# Patient Record
Sex: Male | Born: 1960 | Race: White | Hispanic: No | Marital: Married
Health system: Midwestern US, Community
[De-identification: ages and names within clinical notes are randomized; demographics above are authoritative.]

## PROBLEM LIST (undated history)

## (undated) DIAGNOSIS — J309 Allergic rhinitis, unspecified: Secondary | ICD-10-CM

## (undated) DIAGNOSIS — Z129 Encounter for screening for malignant neoplasm, site unspecified: Secondary | ICD-10-CM

## (undated) DIAGNOSIS — J329 Chronic sinusitis, unspecified: Secondary | ICD-10-CM

## (undated) DIAGNOSIS — J4531 Mild persistent asthma with (acute) exacerbation: Secondary | ICD-10-CM

## (undated) DIAGNOSIS — J4540 Moderate persistent asthma, uncomplicated: Secondary | ICD-10-CM

## (undated) DIAGNOSIS — M5412 Radiculopathy, cervical region: Secondary | ICD-10-CM

---

## 2008-09-07 LAB — RESPIRATORY ALLERGEN PROFILE
Aspergillus alternata IgE: <0.1 kU/l (ref <=0.34)
Aspergillus fumigatus IgE: <0.1 kU/l (ref <=0.34)
Bermuda Grass IgE: 0.47 kU/l — ABNORMAL HIGH (ref <=0.34)
Cat Dander IgE: <0.1 kU/l (ref <=0.34)
Cockroach IgE: 0.32 kU/l (ref <=0.34)
Common-Short Ragweed IgE: 0.64 kU/l — ABNORMAL HIGH (ref <=0.34)
D. farinae IgE: <0.1 kU/l (ref <=0.34)
Elm IgE: 0.42 kU/l — ABNORMAL HIGH (ref <=0.34)
Hormodendrum Hordei IgE: <0.1 kU/l (ref <=0.34)
Immunoglobulin E: 145 [IU]/mL (ref 0–180)
Johnson Grass IgE: 0.48 kU/l — ABNORMAL HIGH (ref <=0.34)
June/Ky Blue(Poa prat.) IgE: 0.64 kU/l — ABNORMAL HIGH (ref <=0.34)
Lamb's Quarter IgE: 0.42 kU/l — ABNORMAL HIGH (ref <=0.34)
Mite Dust Pteronyssinus IgE: <0.1 kU/l (ref <=0.34)
Oak Tree IgE: 0.44 kU/l — ABNORMAL HIGH (ref <=0.34)
Pecan Tree IgE: 0.91 kU/l — ABNORMAL HIGH (ref <=0.34)
Walnut Tree IgE: 0.43 kU/l — ABNORMAL HIGH (ref <=0.34)

## 2008-09-07 LAB — COMMON FOOD ALLERGEN PROFILE
Clams IgE: 0.99 kU/l — ABNORMAL HIGH (ref <=0.34)
Codfish IgE: <0.1 kU/l (ref <=0.34)
Corn Cultivated IgE: 0.39 kU/l — ABNORMAL HIGH (ref <=0.34)
Egg White IgE: <0.1 kU/l (ref <=0.34)
Immunoglobulin E: 145 [IU]/mL (ref 0–180)
Milk, Cow's IgE: <0.1 kU/l (ref <=0.34)
Peanut IgE: 0.4 kU/l — ABNORMAL HIGH (ref <=0.34)
Scallop IgE: 0.53 kU/l — ABNORMAL HIGH (ref <=0.34)
Shrimp IgE: 0.22 kU/l (ref <=0.34)
Soybean IgE: 0.34 kU/l (ref <=0.34)
Walnut IgE: 0.33 kU/l (ref <=0.34)
Wheat IgE: 0.32 kU/l (ref <=0.34)

## 2010-04-06 MED ORDER — HYDROCODONE-HOMATROPINE 5-1.5 MG/5ML PO SYRP
Freq: Four times a day (QID) | ORAL | Status: AC | PRN
Start: 2010-04-06 — End: 2010-04-13

## 2010-04-06 MED ORDER — ALBUTEROL SULFATE HFA 108 (90 BASE) MCG/ACT IN AERS
108 (90 Base) MCG/ACT | Freq: Four times a day (QID) | RESPIRATORY_TRACT | Status: DC | PRN
Start: 2010-04-06 — End: 2010-12-12

## 2010-04-06 NOTE — Progress Notes (Signed)
Subjective:      Patient ID: Harold Gordon is a 50 y.o. male.    HPI Comments: Tried to sleep sitting up last pm - using advil - fever/ chills - started 2 nights ago - really bad last pm - got chest pressure/ burning in chest when laid down flat - thought allergy initially - tree pollen bad per allergy testing - feels some better now but bad night -hard to sleep - temp to 102 2 nights ago. Used dayquil  Yesterday - ? If helped - felt better during day. Bad headache - fever higher in afternoon / evening.      Breathing Problem  He complains of difficulty breathing. Associated symptoms include a fever.   Fever         Review of Systems   Constitutional: Positive for fever.       Objective:   Physical Exam   Constitutional: He is oriented to person, place, and time. He appears well-developed and well-nourished. No distress.   HENT:        Nares mod congestion  Op boggy     Eyes: No scleral icterus.   Pulmonary/Chest: Effort normal. No respiratory distress. He has no wheezes. He has no rales.   Musculoskeletal: He exhibits no edema.   Neurological: He is alert and oriented to person, place, and time.   Skin: Skin is warm and dry.   Psychiatric: He has a normal mood and affect.       Assessment:      1. URI (upper respiratory infection)  X-ray chest PA and lateral           Plan:      Hycodan hs prn / albuterol prn - use and se d/w pt  Push fluids - mucinex D w/ claritin / advil prn  Cxr/ f/u if worsening/ no better in next several days.

## 2010-04-06 NOTE — Telephone Encounter (Signed)
Pt states having chest congestion, hard time breathing, fever, chills.  Tried Night Quil.  Wants appt today.  Per [facility] 11:45 a.m.appt.  Pt informed.

## 2010-05-16 NOTE — Progress Notes (Signed)
Subjective:      Patient ID: Harold Gordon is a 50 y.o. male.    HPI Comments: Fasting today - here for gen'l check up - had allergy testing done - claritin daily - used steroid from allergist and stopped taking it.  Not bad sneeze attacks on claritin. Taking krill oil.  Only used albuterol 1-2x in quite a while.  Sleep pretty good at night - not sure he's interested in colonoscopy - will consider - sleeps pretty well - up around 2 am to urinate.  Coffee in am - 3-4 cups - drinks powerade zero through day. Drinks about 6 beers/ night. Stays active physically = walks a lot. Diet pretty decent - avoids fast food for most part - eats subway for lunch.  Lipid has been high in past. Palpitations periodically - no cp - usually happens at night - feels irregular - no sob/ light-headed.  Last few weeks - some fleeting duoll pain left upper chest - ? Muscular - no ger problem - bm's ok 2-3x/ day. Stream weaker.  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  Hearing slightly decreased - vision declining -on cheaters to read.  Works in truck.  Palpitations not frequent - 1-2x/ months.  Smokes cigars 1/d.  Left numbness related to ddd lumbar.  Back pain if stands in one place esp hard surface for long -hamstrings are tight.       Review of Systems    Objective:   Physical Exam   Constitutional: He is oriented to person, place, and time. He appears well-developed and well-nourished. No distress.   HENT:   Head: Normocephalic and atraumatic.   Mouth/Throat: Oropharynx is clear and moist. No oropharyngeal exudate.   Eyes: Conjunctivae are normal. No scleral icterus.   Neck: No thyromegaly present.        No carotid bruits bilaterally     Cardiovascular: Normal rate, regular rhythm, normal heart sounds and intact distal pulses.    No murmur heard.  Pulmonary/Chest: Effort normal and breath sounds normal. No respiratory distress. He has no wheezes.  He has no rales.   Abdominal: Soft. He exhibits no distension. There is no tenderness.   Musculoskeletal: He exhibits no edema.   Lymphadenopathy:     He has no cervical adenopathy.   Neurological: He is alert and oriented to person, place, and time.   Skin: Skin is warm and dry.   Psychiatric: He has a normal mood and affect.       Assessment:      1. Encounter for screening colonoscopy  Ambulatory referral to Gastroenterology   2. Hypercholesteremia  Lipid panel, Comprehensive metabolic panel   3. Elevated BP     4. Palpitations  EKG 12 lead   5. Prostate cancer screening  PSA, prostatic specific antigen   6. Well adult exam     7. DDD (degenerative disc disease), lumbar             Plan:      otc options for knuckle arthritis d/w pt  Diet/exercise d/w pt  Prostate/ colon ca screen d/w pt - hold on dre -   Hold on further monitoring unless palps worsen - ekg today  Check labs  Monitor bp

## 2010-05-17 LAB — COMPREHENSIVE METABOLIC PANEL
ALT: 41 U/L — ABNORMAL HIGH (ref 10–40)
AST: 32 U/L (ref 15–37)
Albumin/Globulin Ratio: 1.7 (ref 1.1–2.2)
Albumin: 4.6 gm/dl (ref 3.4–5.0)
Alkaline Phosphatase: 65 U/L (ref 45–129)
BUN: 15 mg/dl (ref 7–18)
CO2: 27 mEq/L (ref 21–32)
Calcium: 9.7 mg/dl (ref 8.3–10.6)
Chloride: 106 mEq/L (ref 99–110)
Creatinine: 1.1 mg/dl (ref 0.9–1.3)
GFR Est, African/Amer: >60
GFR, Estimated: >60 (ref >60)
Glucose: 97 mg/dl (ref 70–99)
Potassium: 4.9 mEq/L (ref 3.5–5.1)
Sodium: 140 mEq/L (ref 136–145)
Total Bilirubin: 0.5 mg/dl (ref 0.0–1.0)
Total Protein: 7.4 gm/dl (ref 6.4–8.2)

## 2010-05-17 LAB — LIPID PANEL
Cholesterol, Total: 248 mg/dl — ABNORMAL HIGH (ref <200)
HDL: 70 mg/dl — ABNORMAL HIGH (ref 40–60)
LDL Calculated: 158 mg/dl — ABNORMAL HIGH (ref <100)
Triglycerides: 94 mg/dl (ref <150)
VLDL Cholesterol Calculated: 19 mg/dl

## 2010-05-17 LAB — PSA PROSTATIC SPECIFIC ANTIGEN: PSA: 0.66 ng/ml

## 2010-12-12 NOTE — Progress Notes (Signed)
Subjective:      Patient ID: Harold Gordon is a 50 y.o. male.    HPI Comments: Neck pain for awhile but worse in last week especially - got rearended [place] 6 years ago - sensitive to sleeping wrong since - sneezed and flared things up in last week - hand tingling - pain down left arm - using tylenol/ advil - rom gets affected - gradually improving.  Painful to look down and up again - now much better.  Some rom restriction esp on left arm. 80% better - still pain down left arm if looks up quickly -   More arthritis change in hands -uses hands a lot - ? tx options    Neck Pain         Review of Systems   HENT: Positive for neck pain.        Objective:   Physical Exam   Constitutional: He is oriented to person, place, and time. He appears well-developed and well-nourished. No distress.   Eyes: No scleral icterus.   Pulmonary/Chest: Effort normal.   Musculoskeletal: He exhibits no edema.        Minimal spinous/ neck/ upper back ttp  Fairly good rom neck w/ somewhat decreased rom left shoulder   Neurological: He is alert and oriented to person, place, and time.        Strength/ sensation grossly intact upper exts   Skin: Skin is warm and dry.   Psychiatric: He has a normal mood and affect.       Assessment:      1. Cervical radiculopathy  fexofenadine (ALLEGRA) 180 MG tablet, MRI CERVICAL SPINE WO CONRAST, Ambulatory referral to Physical Therapy   2. Arthritis             Plan:      Mri c-spine - consider PT vs. pmr referral pending sx and mri results    Motrin prn - hold on further tx for now.  tx for oa d/w pt

## 2010-12-12 NOTE — Patient Instructions (Signed)
See note

## 2011-01-10 ENCOUNTER — Telehealth

## 2011-01-10 NOTE — Telephone Encounter (Signed)
PRINTED RESULTS AND LEFT FOR DR Kennith Center' TO REVIEW.

## 2011-01-10 NOTE — Telephone Encounter (Signed)
Pt is calling for results of MRI of his C Spine. Imaging shows that Dr Kennith Center reviewed but no notes for patient. Please call pt with results and instructions.

## 2011-01-11 NOTE — Telephone Encounter (Signed)
Spoke w/ patient - complex discogenic/ arthritic/ bone spur process accounting for c7 radicular sx on left side - sx worsening - refer to dr. Gwenevere Ghazi for eval - d/w pt.

## 2011-01-12 NOTE — Telephone Encounter (Signed)
Pt was referred to [facility] by dr Kennith Center for neck pain, x couple weeks    Recent mri    pls call pt at  [phone]

## 2011-01-13 NOTE — Telephone Encounter (Signed)
Scheduled with JBJ

## 2011-02-06 NOTE — Progress Notes (Signed)
Chief Complaint   Patient presents with   . Neck Pain     Referred by Dr Kennith Center   . Arm Pain     Left arm pain for about 2 months     Pt is daily but mostly annoying. No weaknss or severe numbness.    I have reviewed the patients MRI images as well as radiology report:  Cervical epic: Left c67 hnp/nfs, more mild c45    Pain level  0/10  Location Left arm and neck  Description aching and pins and needles  Radiation   Yes  Duration  2 month(s)  Time  mild    PMH/ROS/Medication/other pertinent history reviewed with patient.    Exam:  Filed Vitals:    02/06/11 1340   BP: 149/96   Pulse: 96       General Exam:  Pt is alert and oriented x 3 and in no acute distress.  Gait is heel toe with no limp or instability.  Mood and affect are appropriate.    HEENT:  normal cephalic.  Neck ROM is Normal with pain at extremes.  Negative Spurling sign to bilateral.  No lymphadenopathy.  No rash or skin irritation.    Upper Extremities:  Bilateral upper extremity with 5/5 motor function, sensation intact to light touch.  Full ROM major joints. No subluxation or instability of major joints.  No cyanosis, clubbing, edema or rash and the vasculature is intact.  Negative Hoffman sign bilateral, trace brachioradialis reflex.    Impression:    1. HNP (herniated nucleus pulposus), cervical  dexamethasone (DECADRON) 6 MG tablet, diclofenac (VOLTAREN) 75 MG EC tablet     A/P  Pt is dealing well with this problem so far.  I recommend he try oral steroid then nsaid.  If no better consider PT or esi if pain severe.    Dimas Chyle, MD    Cc: Nani Ravens, MD

## 2011-02-15 NOTE — Communication Body (Signed)
Chief Complaint   Patient presents with   ??? Neck Pain     Referred by Dr Kennith CenterHines   ??? Arm Pain     Left arm pain for about 2 months     Pt is daily but mostly annoying. No weaknss or severe numbness.    I have reviewed the patients MRI images as well as radiology report:  Cervical epic: Left c67 hnp/nfs, more mild c45    Pain level  0/10  Location Left arm and neck  Description aching and pins and needles  Radiation   Yes  Duration  2 month(s)  Time  mild    PMH/ROS/Medication/other pertinent history reviewed with patient.    Exam:  Filed Vitals:    02/06/11 1340   BP: 149/96   Pulse: 96       General Exam:  Pt is alert and oriented x 3 and in no acute distress.  Gait is heel toe with no limp or instability.  Mood and affect are appropriate.    HEENT:  normal cephalic.  Neck ROM is Normal with pain at extremes.  Negative Spurling sign to bilateral.  No lymphadenopathy.  No rash or skin irritation.    Upper Extremities:  Bilateral upper extremity with 5/5 motor function, sensation intact to light touch.  Full ROM major joints. No subluxation or instability of major joints.  No cyanosis, clubbing, edema or rash and the vasculature is intact.  Negative Hoffman sign bilateral, trace brachioradialis reflex.    Impression:    1. HNP (herniated nucleus pulposus), cervical  dexamethasone (DECADRON) 6 MG tablet, diclofenac (VOLTAREN) 75 MG EC tablet     A/P  Pt is dealing well with this problem so far.  I recommend he try oral steroid then nsaid.  If no better consider PT or esi if pain severe.    Dimas ChyleJohn B. Jacquemin, MD    Cc: Nani Ravensirk R Hines, MD

## 2011-08-07 MED ORDER — IPRATROPIUM BROMIDE 0.06 % NA SOLN
0.06 % | Freq: Four times a day (QID) | NASAL | Status: DC | PRN
Start: 2011-08-07 — End: 2012-01-08

## 2011-08-07 NOTE — Patient Instructions (Signed)
See note

## 2011-08-07 NOTE — Progress Notes (Signed)
Subjective:      Patient ID: Harold Gordon is a 51 y.o. male.    HPI Comments: Cough in am - worst - but some through day - wheezes when inhales.  Starts after been up for few minutes. Going on for awhile - weeks/ months - allegra most days -no change if skips allegra.  Not a lot of nasal drainage/ pnd - more occ sniffles.  White clear phlegm - not more doe than usual.   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    Nothing other than krill oil w/ allegra. No swelling/ leg pain.   Cigars in past - quit to see if helped and hasn't been of any benefit.  No hx of asthma  No gerd except rare w/ spicy food      Cough    Other  Associated symptoms include coughing.       Review of Systems   Respiratory: Positive for cough.        Objective:   Physical Exam   Constitutional: He appears well-developed. No distress.   HENT:   Mouth/Throat: Oropharynx is clear and moist. No oropharyngeal exudate.   Eyes: Conjunctivae are normal. No scleral icterus.   Cardiovascular: Normal rate, regular rhythm, normal heart sounds and intact distal pulses.  Exam reveals no gallop.    No murmur heard.  Pulmonary/Chest: Effort normal and breath sounds normal. No respiratory distress. He has no wheezes. He has no rhonchi. He has no rales.   Abdominal: Soft. Bowel sounds are normal. He exhibits no distension. There is no tenderness.   Musculoskeletal: He exhibits no edema.   Neurological: He is alert.   Skin: Skin is intact. No rash noted. No erythema.   Psychiatric: He has a normal mood and affect.       Assessment:      1. Cough  ipratropium (ATROVENT) 0.06 % nasal spray, X-ray chest PA and lateral, Pulmonary function test            Plan:      Colonoscopy soon  atrovent ns w/ allegra for pnd  If not better in next 1-2 weeks - check cxr/ pft  No ger or other obvious cause -no longer smoking

## 2011-08-24 MED ORDER — ALBUTEROL SULFATE HFA 108 (90 BASE) MCG/ACT IN AERS
108 (90 Base) MCG/ACT | Freq: Once | RESPIRATORY_TRACT | Status: AC
Start: 2011-08-24 — End: 2011-08-24
  Administered 2011-08-24: 20:00:00 via RESPIRATORY_TRACT

## 2011-08-25 MED ORDER — ALBUTEROL SULFATE HFA 108 (90 BASE) MCG/ACT IN AERS
108 (90 Base) MCG/ACT | Freq: Four times a day (QID) | RESPIRATORY_TRACT | Status: DC | PRN
Start: 2011-08-25 — End: 2011-12-20

## 2011-08-25 MED FILL — VENTOLIN HFA 108 (90 BASE) MCG/ACT IN AERS: 108 (90 Base) MCG/ACT | RESPIRATORY_TRACT | Qty: 8

## 2011-08-25 NOTE — Procedures (Signed)
PATIENT NAME:                   PA #:             MR #Harold Gordon, Harold Gordon                   [id number]        [id number]         ATTENDING PHYSICIAN:                     DATE:        DIS DATE:       Dierdre Forth, MD                  08/24/2011   08/24/2011      DATE OF BIRTH:     AGE:            PATIENT TYPE:      RM #:           04/06/1960         51              OPF                                   INDICATION:  Chronic cough.     INTERPRETATION:  Spirometry showed normal FVC of 4.4 L 102% predicted and  normal FEV1 of 3.2 L 91% predicted.  FEV1/FVC ratio was decreased.  There was  a positive response to bronchodilators demonstrated.  Lung volumes showed  normal total lung capacity of 96% predicted.  Diffusion capacity showed  normal DLCO of 100% predicted.       IMPRESSION:  Mild obstructive defect with positive response to  bronchodilators, normal lung volumes and diffusion capacity.                                              Joycelyn Das, MD     [id number]  DD: 08/25/2011 12:51   DT: 08/25/2011 13:19   Job #: [id number]  CC: Army Melia, MD

## 2011-12-21 MED ORDER — PROVENTIL HFA 108 (90 BASE) MCG/ACT IN AERS
108 (90 Base) MCG/ACT | RESPIRATORY_TRACT | Status: DC
Start: 2011-12-21 — End: 2012-02-27

## 2011-12-26 NOTE — Other (Unsigned)
CURRENT STATUS IS EMERGENCY PENDING CARE MANAGEMENT DESIGNATION.     Any questions regarding MEDICAL RECORDS should be directed to Medical   Records: [facility] [phone] or [facility]   [phone]. Any questions regarding BILLING should be directed to the Care   Management Departments: [facility] [phone] or [facility] [phone].         _________________________________  Signed byJamison Neighbor:    [facility]  NOTIFY    ZZ    D: 12/26/2011 11:34 PM  T:    This document is confidential medical information.  Unauthorized disclosure or   use of this information is prohibited by law.  If you are not the intended recipient of this document, please advise us by   calling immediately [phone].

## 2011-12-27 NOTE — Other (Unsigned)
ED PATIENT ENCOUNTER ARRIVAL: 12/26/11 2334     EMERGENCY DEPARTMENT - General NOTE     CHIEF COMPLAINT Chief Complaint Patient presents with   Cough   chest   congestion, nasal congestion, feels SOB for about 1 wk     HPI Harold Gordon is a 51 year old male who presents to the ER for nasal   congestion, cough and shortness of breath for approximately one week. Patient   has been diagnosed with asthma in the past. Patient came in today because he   was coughing and having trouble breathing. Patient tried her albuterol inhaler   at home without relief.     PAST MEDICAL HISTORY Past Medical History Diagnosis Date   Asthma     SURGICAL HISTORY Past Surgical History Procedure Laterality Date     Appendectomy   Back surgery     CURRENT MEDICATIONS Prior to Admission medications Not on File     ALLERGIES No Known Allergies     FAMILY HISTORY No family history on file.     SOCIAL HISTORY History     Social History   Marital Status: Married   Spouse Name: N/A   Number of   Children: N/A   Years of Education: N/A     Social History Main Topics   Smoking status: Light Tobacco Smoker   Smokeless   tobacco: Never Used   Alcohol Use: Not on file   Drug Use: Not on file     Sexually Active: Not on file     Other Topics Concern   Not on file     Social History Narrative   No narrative on file     REVIEW OF SYSTEMS Constitutional:  Denies fever, chills, weight loss or   weakness Eyes:  Denies photophobia or discharge HENT:  Denies sore throat or   ear pain Respiratory:  Positive for cough. Positive for shortness of breath.   Cardiovascular:  Denies chest pain, palpitations or swelling GI:  Denies   abdominal pain, nausea, vomiting, or diarrhea Musculoskeletal:  Denies back   pain Skin:  Denies rash Neurologic:  Denies headache, focal weakness or   sensory changes Endocrine:  Denies polyuria or polydypsia Lymphatic:  Denies   swollen glands Psychiatric:  Denies depression, suicidal ideation or homicidal   ideation All systems  negative except as marked.     PHYSICAL EXAM VITAL SIGNS: BP 137/85   Pulse 91   Temp(Src) 98.1  F (36.7  C)   (Oral)   Resp 20   SpO2 96% Constitutional:  Well developed, Well nourished,   No acute distress, Non-toxic appearance. HENT:  Normocephalic, Atraumatic,   Bilateral external ears normal, Oropharynx moist, No oral exudates, Nose   normal. Neck- Normal range of motion, No tenderness, Supple, No stridor. Eyes:    PERRL, EOMI, Conjunctiva normal, No discharge. Respiratory:  Normal breath   sounds, No respiratory distress, No chest tenderness. Positive for mild   wheezing throughout. Cardiovascular:  Normal heart rate, Normal rhythm, No   murmurs, No rubs, No gallops. GI:  Bowel sounds normal, Soft, No tenderness,   No masses, No pulsatile masses. Musculoskeletal:  Intact distal pulses, No   edema, No tenderness, No cyanosis, No clubbing. Good range of motion in all   major joints. No tenderness to palpation or major deformities noted. Back- No   tenderness. Integument:  Warm, Dry, No erythema, No rash. Lymphatic:  No   lymphadenopathy noted. Neurologic:  Alert & oriented  x 3, Normal motor   function, Normal sensory function, No focal deficits noted. Psychiatric:    Affect normal, Judgment normal, Mood normal.     LABORATORY No results found for this or any previous visit (from the past 24   hour(s)).     RADIOLOGY XR CHEST PA AND LATERAL    (Results Pending)     Chest x-ray: Interpretation done by myself. Negative for acute process.     ED COURSE & MEDICAL DECISION MAKING Pertinent Labs & Imaging studies   reviewed. (See chart for details) Patient felt better in the ER after an   aerosol treatment. Patient states for the past couple of days he has been   trying his therapy for an inhaler without relief. Patient denies fever chills.   Since his symptoms have been going on for approximately one week and is   having trouble breathing and not getting much relief from the inhaler I will   start him on antibiotics.  I will also start him on prednisone along with a   taper. After the aerosol treatment he felt much better and his breathing   improved significantly. Patient is stable appropriate for discharge and   followup in the outpatient setting. At no time in the ER was he in acute   respiratory arrest.     The patient was given the following medication in the Emergency department:     Medication Administration from 12/26/2011 2334 to 12/27/2011 0022      Date/Time Order Dose Route Action Action by Comments   12/26/2011 2356   ipratropium-albuterol (DUONEB) nebulizer solution 3 mL 3 mL Nebulization Given   Imogene Burnonya R Popov   12/27/2011 0016 azithromycin (ZITHROMAX) tablet 500 mg 500 mg   Oral Given Maryfrances BunnellStephanie A Becker   12/27/2011 0016 predniSONE (DELTASONE) tablet   60 mg 60 mg Oral Given Maryfrances BunnellStephanie A Becker         FINAL DIAGNOSIS:     1. Acute upper respiratory infection 2. Shortness of breath 3. Cough         The patient was given the following medications to go home with.     Discharge Medication List as of 12/27/2011 12:17 AM     START taking these medications  Details azithromycin (ZITHROMAX) 500 MG TABS   Take 1 tablet by mouth daily for 4 days.Normal, Disp-4 tablet, R-0     methylPREDNISolone 4 MG KIT Take as directed until gonePrint, Disp-1 kit, R-0         Electronically signed by: West PughJoel A. Neil Crouchrane, DO         Drucie Iprane, Joel A., DO 12/27/11 0310         _________________________________  Signed by:    West PughJOEL A. CRANE    10    D: 12/27/2011 03:10 AM  T: 12/27/2011 03:10 AM    This document is confidential medical information.  Unauthorized disclosure or   use of this information is prohibited by law.  If you are not the intended recipient of this document, please advise us by   calling immediately [phone].

## 2012-01-08 MED ORDER — NORMAL SALINE FLUSH 0.9 % IV SOLN
0.9 % | INTRAVENOUS | Status: DC
Start: 2012-01-08 — End: 2012-01-09

## 2012-01-08 MED ORDER — SODIUM CHLORIDE 0.9 % IJ SOLN
0.9 % | Freq: Once | INTRAMUSCULAR | Status: DC | PRN
Start: 2012-01-08 — End: 2012-01-09

## 2012-01-08 MED ORDER — IOVERSOL 68 % IV SOLN
68 % | Freq: Once | INTRAVENOUS | Status: AC | PRN
Start: 2012-01-08 — End: 2012-01-08
  Administered 2012-01-08: 20:00:00 via INTRAVENOUS

## 2012-01-08 MED ORDER — HYDROCODONE-HOMATROPINE 5-1.5 MG/5ML PO SYRP
Freq: Four times a day (QID) | ORAL | Status: AC | PRN
Start: 2012-01-08 — End: 2012-01-15

## 2012-01-08 MED FILL — NORMAL SALINE FLUSH 0.9 % IV SOLN: 0.9 % | INTRAVENOUS | Qty: 20

## 2012-01-08 NOTE — Progress Notes (Signed)
Subjective:      Patient ID: Harold Gordon is a 52 y.o. male.    HPI  PT C/O SEVERE CHEST CONGESTION WITH SOB, COUGH WITH CLEAR PHLEGM, NASAL DR CLEAR, PND, HEADACHE, NO FEVER ON/OFF X2 YEARS.  PT WAS SEEN IN THE ER ON CHRISTMAS EVE FOR THE SAME SYMPTOMS.  PT HAS HISTORY OF WORKING IN CHEMICALS FOR 20 YEARS.  tx'd for zpak/ medrol dose pack - albuterol neb which works better than hfa  Progressively worsening for 2 years - now more sob - took albuterol every 2 hours last pm to sleep - clear mucus - bad again today - this seems to be intermittent - ? Tied into breathing/ cough.  Noted pnd   No asthma as a kid -   Not smoking cigarettes for 20 years - no cigars for awhile  No f/c/ aches in last week or two.  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  Last albuterol about 8 hours ago.    Review of Systems    Objective:   Physical Exam   Constitutional: He appears well-developed. No distress.   HENT:   Mouth/Throat: Oropharynx is clear and moist.   Eyes: Conjunctivae are normal. No scleral icterus.   Cardiovascular: Normal rate, regular rhythm and normal heart sounds.  Exam reveals no gallop.    No murmur heard.  Pulmonary/Chest: Effort normal and breath sounds normal. No respiratory distress. He has no wheezes. He has no rhonchi. He has no rales.   Musculoskeletal: He exhibits no edema.   Neurological: He is alert.   Skin: Skin is intact. No rash noted. No erythema.   Psychiatric: He has a normal mood and affect.       Assessment:      1. SOB (shortness of breath)  External Referral To Pulmonology, CT chest with contrast   2. 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

## 2012-01-08 NOTE — Patient Instructions (Addendum)
See note

## 2012-01-10 MED ORDER — BENZONATATE 200 MG PO CAPS
200 MG | ORAL_CAPSULE | ORAL | Status: DC
Start: 2012-01-10 — End: 2012-02-27

## 2012-01-10 NOTE — Telephone Encounter (Signed)
Pt needs a referral to a pulmonologist.  Dr Katherine MantleIsentroth is not seeing new pt  Pt has called twice and they are not calling him back..    Do you have the name of one pt can see?

## 2012-01-11 NOTE — Telephone Encounter (Signed)
Per [facility] can have him see Dr.Colangelo.  Called pt he is driving he will call back for number.  It is [phone].

## 2012-01-11 NOTE — Telephone Encounter (Signed)
Called pt again.  Pt was able to write down number this time.

## 2012-01-26 MED ORDER — MOMETASONE FUROATE 50 MCG/ACT NA SUSP
50 MCG/ACT | Freq: Every day | NASAL | Status: DC
Start: 2012-01-26 — End: 2013-06-25

## 2012-01-26 NOTE — Progress Notes (Signed)
Subjective:      Patient ID: Harold Gordon is a 52 y.o. male.    HPI  The patient is referredfor evaluation of cough, wheezing and shortness of breath by Dr. Kennith Center. The patient has a history of cough that goes back several years. He also gives a history of allergies. He had some allergy testing about 3 years ago and had a number of environmental allergies. He says his allergies are worse in the spring and the fall but he has constant sinus drainage. He has occasional gastroesophageal reflux but this is a minor symptom for him. He has coughing and often produces some phlegm in the morning. He has had wheezing. He ended up in the emergency room on Christmas Eve this year with a flareup of wheezing and shortness of breath. He was given a breathing treatment in the ER and that helped a lot. Also, Dr Kennith Center gave him [facility] inhaler. Since starting this inhaler, he has not had to use any rescue inhaler. The patient works as a Geophysical data processor. He smokes an occasional cigar. He is exposed at times to cigarette smoke but not a big issue for him. He also builds Par[employer] as a hobby. He does a lot of saw him, standing staining and uses lacquers. He wears a respirator when he is using a lacquer but otherwise does not wear respiratory protection during that process.    Review of Systems   Constitutional: Negative for fever and fatigue.   HENT: Positive for postnasal drip. Negative for congestion and sinus pressure.    Eyes: Negative for visual disturbance.   Cardiovascular: Negative for chest pain and palpitations.   Gastrointestinal: Negative for nausea, abdominal pain, diarrhea and constipation.   Genitourinary: Negative for difficulty urinating.   Musculoskeletal: Negative for arthralgias.   Skin: Negative for rash.   Neurological: Negative for dizziness and light-headedness.   Hematological: Does not bruise/bleed easily.   Psychiatric/Behavioral: Negative for behavioral problems.       Objective:   Physical Exam    Constitutional: He is oriented to person, place, and time. He appears well-developed and well-nourished. No distress.   HENT:   Nasal mucosa, teeth, gums and oropharynx are normal.   Eyes: No scleral icterus.   Neck: Neck supple. No JVD present. No tracheal deviation present. No thyromegaly (No other masses noted in the neck.) present.   Cardiovascular: Normal rate, regular rhythm, normal heart sounds and intact distal pulses.    No murmur heard.  Pulmonary/Chest: Effort normal. No respiratory distress. He has wheezes. He has no rales. He exhibits no tenderness.   Chest is symmetric and has a normal percussion note. There is no tactile fremitus.   Abdominal: Soft. He exhibits no distension and no mass (There is no hepatosplenomegaly.). There is no tenderness.   Musculoskeletal: He exhibits no edema (There is no clubbing or cyanosis of the digits. There is alsono obvious weakness or atrophy of the musculature.).   Lymphadenopathy:     He has no cervical adenopathy.     He has no axillary adenopathy.        Right: No inguinal adenopathy present.        Left: No inguinal adenopathy present.   Neurological: He is alert and oriented to person, place, and time.   Skin: Skin is warm and dry.   Psychiatric: He has a normal mood and affect.       Assessment:      1. Asthma  Respiratory allergen profile  2. Chronic cough  Respiratory allergen profile   3. Environmental allergies     4. Perennial sinusitis              Plan:      1. I was able to review records from the emergency department from 12/26/11. He was wheezing at that time. As stated above, he had a good response to nebulized albuterol.  2. Pulmonary function testing here reveals mild obstructive lung disease and a good response to inhaled bronchodilators.  3. I also reviewed the CT images. These were normal.  4. I think the patient likely has asthma. There may be a significant component of sinus allergies as well.  5. He is taking some Allegra. I asked him to  take this daily.  6. Add Nasacort.  7. Continue Breo once daily.  8. Continue albuterol p.r.n.  9. Obtain respiratory allergen profile  10. Followup one month

## 2012-02-27 MED ORDER — PREDNISONE 10 MG PO TABS
10 MG | ORAL_TABLET | ORAL | Status: DC
Start: 2012-02-27 — End: 2012-03-06

## 2012-02-27 NOTE — Patient Instructions (Signed)
See note

## 2012-02-27 NOTE — Progress Notes (Signed)
Subjective:      Patient ID: Harold Gordon is a 52 y.o. male.    Rash      Rash began w/in days of starting cholesterol med - had given breo inhaler - saw pulm guy - continued inhalers.  Quit taking inhaler w/o difference - rash going  On for 2 weeks w/ spots on arms - stopped statin.  Itchy bumpy rash  Started taking allegra/ nasonex -   Had normal ct angiogram - rash there before nasonex.  Sees dr. Chestine Spore tomorrow -   Sees dr. Sheryn Bison again on 3/5.        Review of Systems   Skin: Positive for rash.       Objective:   Physical Exam   Constitutional: He is oriented to person, place, and time. He appears well-developed and well-nourished. No distress.   Eyes: No scleral icterus.   Pulmonary/Chest: Effort normal.   Musculoskeletal: He exhibits no edema.   Neurological: He is alert and oriented to person, place, and time.   Skin: Skin is warm and dry.   Papules, pink - some excoriated upper arms -symmetric locations lower and upper  Red macular patch abd symmetric each side of abd.   Psychiatric: He has a normal mood and affect.       Assessment:      1. Dermatitis    2. 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 Medication reaction  Consider bx if continued sx.  Consider derm if continued sx   cont allegra

## 2012-03-06 MED ORDER — FLUTICASONE FUROATE-VILANTEROL 100-25 MCG/INH IN AEPB
100-25 MCG/INH | Freq: Every day | RESPIRATORY_TRACT | Status: DC
Start: 2012-03-06 — End: 2012-09-16

## 2012-03-06 NOTE — Progress Notes (Signed)
[facility]  Progress Note  Harold Hurst, MD       Harold Gordon   Date of Birth:  March 06, 1960    Date of Visit:  03/06/2012    No Known Allergies  Current Outpatient Prescriptions   Medication Sig Dispense Refill   ??? Fluticasone Furoate-Vilanterol (BREO ELLIPTA) 100-25 MCG/INH AEPB Inhale 1 puff into the lungs daily.  1 each  6   ??? pravastatin (PRAVACHOL) 10 MG tablet Take 1 tablet by mouth daily.       ??? mometasone (NASONEX) 50 MCG/ACT nasal spray 2 sprays by Nasal route daily.  1 Inhaler  3     No current facility-administered medications for this visit.       Filed Vitals:    03/06/12 1645   BP: 143/108   Pulse: 100     There is no weight on file to calculate BMI.     Wt Readings from Last 3 Encounters:   02/27/12 182 lb 3.2 oz (82.645 kg)   01/26/12 183 lb (83.008 kg)   01/08/12 183 lb (83.008 kg)     BP Readings from Last 3 Encounters:   03/06/12 143/108   02/27/12 112/60   01/26/12 132/92        History   Smoking status   ??? Former Smoker   ??? Types: Cigars   Smokeless tobacco   ??? Never Used       HPI  The patient presents today for follow-up of asthma and allergies.  His breathing has really stabilized nicely.  However, he continues to have sinus drainage and cough.  The patient is convinced that the cough is the result of his sinus drainage.  I tend to agree with him.  The patient has been taking Nasonex and Allegra regularly since his last visit.  Unfortunately, there has been no noticeable benefit.    Review of Systems  As documented in HPI     Physical Exam:  Well developed, well nourished  Alert and oriented  Sclera is clear  No cervical adenopathy  No JVD.  Chest examination is clear.  Cardiac examination reveals regular rate and rhythm without murmur, gallop or rub.  The abdomen is soft, nontender and nondistended.   There is no clubbing, cyanosis or edema of the extremities.  There is no obvious skin rash.  No focal neuro deficicts  Normal mood and  affect    Assessment/Plan    1. Perennial sinusitis    2. Asthma    3. Environmental allergies        Plan:  1.  Continue Breo.  This has helped his asthma.  2.  He will also continue Nasonex and Allegra.  3.  I think he would benefit from ENT Evaluation.  Referral is made to Dr. Tommie Raymond

## 2012-04-17 NOTE — Progress Notes (Signed)
[facility]         PCP:  Nani Ravens, MD    REFERRED BY:  [name], MD       Chief Complaint   Patient presents with   ??? Other     post nasal drip for years, coughing up mucus in the mornings,clear in color       HISTORY OF PRESENT ILLNESS:   Location: Nose and throat  Quality: post nasal drip, milky white, mostly in the morning, some in afternoon.  Severity: severe in morning.   Duration: 10 years.  Timing: comes and goes.    Context: h/o allergies springtime, tree pollen; no immunotherapy, allegra and nasonex  Associated symptoms: cough to clear mucus.      REVIEW OF SYSTEMS:   Constitutional:  Denied current fever, chills, and weight loss.     Ears, Nose Throat:  Hearing loss, high frequency and tinnitus, high pitched ring, for ten years.  No recent changes.  Report stuffy nose, with allergies.  Denied current otalgia, otorrhea, dizziness, vertigo, epistaxis, nasal dyspnea, rhinorrhea, sore throat and chronic hoarseness.    Respiratory:  Denied current chronic cough.    Gastrointestinal:  Denied current nausea, vomiting, and dysphagia.        PAST MEDICAL HISTORY:   Past Medical History   Diagnosis Date   ??? Allergic rhinitis    ??? Asthma      ALLERGY INDUCED ASTHMA       Past Surgical History   Procedure Laterality Date   ??? Back surgery  1989     L4-L5   ??? Appendectomy  AGE 20       EXAMINATION:  Filed Vitals:    04/17/12 1618   BP: 124/86   Pulse: 89     Constitutional:  ?? Vitals signs reviewed, normal.  ?? General appearance: Well developed, well nourished, no apparent deformities.  ?? Ability to communicate/quality of Voice: Communicated without difficulty.  Normal voice.  No hoarseness.       Head and Face:  ?? Inspection: Normal overall appearance, with no scars, lesions or masses.  ?? Sinuses: The maxillary and frontal sinuses were nontender, bilaterally.         ?? Salivary glands:  Parotid, submandibular and sublingual glands were normal.    ?? Facial strength,  motion: Normal and equal for all five branches bilaterally.      Eyes:   ?? Extraocular motion: intact, normal primary gaze alignment.    Ears, nose, mouth, & throat:  ?? Otoscopic exam:   Right ear:  External auditory canal was normal. Tympanic membrane was normal.     Left ear:  External auditory canal was normal. Tympanic membrane was normal.      ?? Hearing assessment:  Able to hear finger rub and soft voice bilaterally. Tuning fork tests:  Weber midline and Rinne air > bone bilaterally, 512 Hertz tuning fork.     ?? External Ear/Nose: Normal pinnae. Normal external nose. No scars, lesions or masses.  ?? Nose: The nasal septum was deviated to the left.  The inferior turbinates were normal.  The nasal mucosa and secretions were normal.  No pus or polyps were seen.       ?? Oropharynx/oral cavity:  Oral mucosa, hard and soft palates, tongue,and pharynx were normal.        Neck:   ?? Neck:  Normal.  No masses, tenderness, abnormal appearance, asymmetry or abnormal tracheal position.    ??  Thyroid: Normal.  No nodules, enlargement, tenderness or masses.       Lymphatic:   ?? Palpation of lymph nodes, cervical, facial and supraclavicular: Nontender, No lymphadenopathy.      Neurological/Psychiatric:    ?? Orientation to time, place, and person: Oriented x 3.  ?? Mood and Affect: Normal.  No evidence of depression, anxiety or agitation.

## 2012-05-22 NOTE — Progress Notes (Addendum)
REVIEW OF IMAGES:       I reviewed the images of the CT scan of the sinuses, showing mild to moderate mucosal thickening in the ethmoid, maxillary and left frontal sinuses, and leftward septal deviation. There is opacification of the left frontal recess and lower frontal sinus.      Medications have helped decrease allergy symptoms, but not PND and cough, which occurs every morning, and sometimes during the daytime.  He gets "a real heavy drip" and "it makes me cough."  Never constant.  "It hits" and he will cough for 15 minutes and then it stops.  Occasionally will linger for a few hours.      Dr. Sheryn Bison, pulmologist, did a CT scan of lungs, which was clear, and rec he see ENT.    Has some hearing loss; did not hear high pitch air conditioner wound at hotel recently, can't hear turn signal in car.  Hearing and tinnitus getting worse.    TIME:  I spent a total of 25 minutes with this patient; counseling time = 20 minutes.  More than 50% of the visit was spent counseling and/or coordination of care.

## 2012-05-22 NOTE — Telephone Encounter (Signed)
Daily inhaler in not approved by insurance

## 2012-05-22 NOTE — Patient Instructions (Addendum)
1. Do a nasal-sinus rinse three times daily (eg. NeilMed sinus rinse, OTC) using distilled water; DO NOT USE TAP WATER!  This is because of the possibility of ameba microorganisms in  tap water, which can result in a fatal disease.   2. Take one Mucinex-D (red orange box) maximum strength tablet each morning and one Mucinex (blue box) maximum strength tablet each evening, about 12 hours later, daily for the next fourteen days.  3. Continue Nasonex.  4. Continue Allegra.  5. Consider functional endoscopic sinus surgery, including balloon sinuplasty.    6. Consider allergy immunotherapy, if allergies are not controlled by medications.    7. Proceed with a hearing test to evaluate the increased hearing loss and tinnitus.  8. Take Lipoflavonoid, two tablets by mouth three times a day for 60 days, then one tablet three times a day for 4 to 6 months, for tinnitus.

## 2012-05-22 NOTE — Telephone Encounter (Signed)
Pt is on Breo and we are trying to get this covered. LM for pt this info and that there are samples for him here in the mean time.

## 2012-07-03 NOTE — Patient Instructions (Addendum)
1. Lipoflavonoid: Take 2 tablets by mouth three times a day for the first 60 days, then take 1 tablet by mouth three times a day for the next 4 months.  Then continue if this has been effective.  2. Use the masking techniques we discussed as needed for tinnitus.   3. You should have an annual hearing test and as needed for significant change/decrease in hearing.  4. You should avoid exposure to excessively high levels of noise/sound and use hearing protection measures we discussed, as needed, if such exposure is unavoidable.  5. You should never clean your ears with a Q-tip, cotton tipped applicator, safety pin, or any other instrument.  I recommend only use of one the several ear wax removal kits available "over the counter" if you feel a need to try to remove ear wax.   No other methods should be self used for this purpose as there is danger of injury to the ear and risk of irreparable and irreversible permanent hearing loss.   6. Continue Nasonex and start Allegra-D.  If medical therapy does not sufficiently improve nasal your airway, then I recommend septal reconstruction and turbinate reduction surgery to improve your nasal airway and breathing.    7. Return in 6 weeks for recheck and sooner if your condition worsens.  You should also return for any further ENT problems.

## 2012-07-05 NOTE — Progress Notes (Addendum)
EXAMINATION:  TMs intact with no perforations.  Moderate NSD to the left, obstructive.    AUDIOGRAM (06/20/2012):   Bilateral mild to moderate symmetrical, high frequency sensorineural hearing loss in a noise induced hearing loss pattern. SRT 10 dB right and 10 dB left; word recognition score 96% right and 96% left. Tympanogram - type A bilaterally.      CT SCAN SINUSES:  Mild to moderate mucosal thickening in the ethmoid, maxillary and left frontal sinuses.      COUNSELING:  Audiogram results reviewed and discussed with Antonyo Hinderer.  I advised of type of hearing loss, probable etiology, possible associated impairment and disability, need for noise protection and avoidance, possible benefits of amplification/hearing aid, etiology of tinnitus and treatment/coping measures including masking techniques, and need for annual and prn hearing tests.  Also discussed septal deviation and inferior turbinate hypertrophy with nasal obstruction and dyspnea.  Discussed septal reconstruction and inferior turbinate reduction.        IMPRESSION:    1. 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 Tinnitus, subjective, bilateral    4. Nasal septal deviation    5. Nasal turbinate hypertrophy    6. Nasal obstruction        PLAN:    Patient Instructions   1. Lipoflavonoid: Take 2 tablets by mouth three times a day for the first 60 days, then take 1 tablet by mouth three times a day for the next 4 months.  Then continue if this has been effective.  2. Use the masking techniques we discussed as needed for tinnitus.   3. You should have an annual hearing test and as needed for significant change/decrease in hearing.  4. You should avoid exposure to excessively high levels of noise/sound and use hearing protection measures we discussed, as needed, if such exposure is unavoidable.  5. You should never clean your ears with a Q-tip, cotton tipped applicator, safety pin, or any other  instrument.  I recommend only use of one the several ear wax removal kits available "over the counter" if you feel a need to try to remove ear wax.   No other methods should be self used for this purpose as there is danger of injury to the ear and risk of irreparable and irreversible permanent hearing loss.   6. Continue Nasonex and start Allegra-D.  If medical therapy does not sufficiently improve nasal your airway, then I recommend septal reconstruction and turbinate reduction surgery to improve your nasal airway and breathing.    7. Return in 6 weeks for recheck and sooner if your condition worsens.  You should also return for any further ENT problems.      Total visit time = 25 minutes; Counseling time = 20 minutes.

## 2012-07-17 NOTE — Telephone Encounter (Signed)
[facility] up on [facility]  [id number]   ID - [id number].

## 2012-07-19 NOTE — Telephone Encounter (Signed)
[facility] informed we faxed a prior auth to pt's insurance and waiting to hear back. Pt has been getting samples from [place] on the mean time.

## 2012-07-26 NOTE — Telephone Encounter (Signed)
Pt of Dr Colangelo's

## 2012-07-26 NOTE — Telephone Encounter (Signed)
Pharmacy  called regarding a [facility] authorization  Reference ID # [id number]  Call [phone]

## 2012-07-31 NOTE — Telephone Encounter (Signed)
PA sent a few days ago.  Wanting to know the status.   Breo Ellipta 100 dose 25

## 2012-07-31 NOTE — Telephone Encounter (Signed)
LM for [facility] that we are waiting to hear from pt's insurance and we are supplying pt w/samples

## 2012-08-09 NOTE — Telephone Encounter (Signed)
Pharmacy informed insurance has not approved yet but pt is being given samples from our office when he needs them

## 2012-08-09 NOTE — Telephone Encounter (Signed)
[facility] called for update on patients medication. Please call them at [phone]

## 2012-08-19 NOTE — Telephone Encounter (Signed)
Breo ellitpa, will  need a prior authorization-  [employer]- Care id [id number]- Phone for [facility]  [phone]

## 2012-08-19 NOTE — Telephone Encounter (Signed)
Already addressed

## 2012-09-05 NOTE — Telephone Encounter (Signed)
Pt called @ his breo he was yelling and wants another medicine or he is going to another doctor , asking to please call him. He is using his emergency inahler too much.

## 2012-09-06 NOTE — Telephone Encounter (Signed)
Spoke w/pt and [employer] is telling pt that they have not received anything from [place]. I told pt I would call them on Monday and see what is going on and call him back. He stated that would be fine.

## 2012-09-16 MED ORDER — FLUTICASONE FUROATE-VILANTEROL 100-25 MCG/INH IN AEPB
100-25 MCG/INH | Freq: Every day | RESPIRATORY_TRACT | Status: DC
Start: 2012-09-16 — End: 2013-05-19

## 2012-09-16 NOTE — Telephone Encounter (Signed)
i sent rx through to her pharmacy to reinitiate PA process.  We can call and try to get this through at number given.  Dx is copd.  rx'd by pulm works well  Not sure if he has used advair, Tax adviser or symbicort before - these could be preferred.  In meantime - can give him samples.

## 2012-09-16 NOTE — Telephone Encounter (Signed)
Pt was referred to Dr Sheryn Bisonolangelo and was given samples and prescription for Breo-Ellipta 100-25 mcg/INH AEPB.  Patient has run out of samples and has been unable to get Colangelo to get this prior authed. They keep telling him it was faxed and [employer] is telling him that someone needs to call to initiate the process. Can we please help him out. He was doing great with the samples and now says he is afraid he is going to end up back in the hospital because he doesn't have any.     [employer] [facility] review number is [phone]

## 2012-09-16 NOTE — Telephone Encounter (Signed)
Called pt he said someone already called him and told him med was approved.

## 2012-09-17 MED ADMIN — methylPREDNISolone acetate (DEPO-MEDROL) injection 80 mg: INTRAMUSCULAR | @ 21:00:00 | NDC 00009030602

## 2012-09-17 MED ADMIN — albuterol (PROVENTIL) nebulizer solution 2.5 mg: RESPIRATORY_TRACT | @ 21:00:00 | NDC 00487950101

## 2012-09-17 NOTE — Patient Instructions (Signed)
Asthma in Adults: After Your Visit  Your Care Instructions     During an asthma attack, your airways swell and narrow as a reaction to certain things (triggers). This makes it hard to breathe.  You may be able to prevent asthma attacks if you avoid the things that set off your asthma symptoms. Keeping your asthma under control and treating symptoms before they get bad can help you avoid severe attacks.  If you can control your asthma, you may be able to do all of your normal daily activities. You may also avoid asthma attacks and trips to the hospital.  Follow-up care is a key part of your treatment and safety. Be sure to make and go to all appointments, and call your doctor if you are having problems. It's also a good idea to know your test results and keep a list of the medicines you take.  How can you care for yourself at home?  ?? Follow your asthma action plan so you can manage your symptoms at home. An asthma action plan will help you prevent and control airway reactions and will tell you what to do during an asthma attack. If you do not have an asthma action plan, work with your doctor to build one.  ?? Take your asthma medicine exactly as prescribed. Medicine plays an important role in controlling asthma. Talk to your doctor right away if you have any questions about what to take and how to take it.  ?? Use your quick-relief medicine when you have symptoms of an attack. Quick-relief medicine often is an albuterol inhaler. Some people need to use quick-relief medicine before they exercise.  ?? Take your controller medicine every day, not just when you have symptoms. Controller medicine is usually an inhaled corticosteroid. The goal is to prevent problems before they occur. Do not use your controller medicine to try to treat an attack that has already started. It does not work fast enough to help.  ?? If your doctor prescribed corticosteroid pills to use during an attack, take them as directed. They may take  hours to work, but they may shorten the attack and help you breathe better.  ?? Keep your quick-relief medicine with you at all times.  ?? Talk to your doctor before using other medicines. Some medicines, such as aspirin, can cause asthma attacks in some people.  ?? Learn how to use a peak flow meter to check how well you are breathing. This can help you predict when an asthma attack is going to occur. Then you can take medicine to prevent the asthma attack or make it less severe.  ?? See your doctor regularly. These visits will help you learn more about asthma and what you can do to control it. Your doctor will monitor your treatment to make sure the medicine is helping you.  ?? Keep track of your asthma attacks and your treatment. After you have had an attack, write down what triggered it, what helped end it, and any concerns you have about your asthma action plan. Take your diary when you see your doctor. You can then review your asthma action plan and decide if it is working.  ?? Take your peak flow meter with you when you visit your doctor. Together, you can review the way you use it.  ?? Do not smoke or allow others to smoke around you. Avoid smoky places. Smoking makes asthma worse. If you need help quitting, talk to your doctor about stop-smoking programs and   medicines. These can increase your chances of quitting for good.  ?? Learn what triggers an asthma attack for you, and avoid the triggers when you can. Common triggers include colds, smoke, air pollution, dust, pollen, mold, pets, cockroaches, stress, and cold air.  ?? Avoid colds and the flu. Get a pneumococcal vaccine shot. If you have had one before, ask your doctor whether you need a second dose. Get a flu vaccine every fall. If you must be around people with colds or the flu, wash your hands often.  When should you call for help?  Call 911 anytime you think you may need emergency care. For example, call if:  ?? You have severe trouble breathing.  Call your  doctor now or seek immediate medical care if:  ?? Your symptoms do not get better after you have followed your asthma action plan.  ?? You cough up yellow, dark brown, or bloody mucus (sputum).  Watch closely for changes in your health, and be sure to contact your doctor if:  ?? Your coughing and wheezing get worse.  ?? You need to use quick-relief medicine on more than 2 days a week (unless it is just for exercise).  ?? You need help figuring out what is triggering your asthma attacks.   Where can you learn more?   Go to [email or web address] and sign in to your MyChart account. Enter P597 in the Search Health Information box to learn more about ???Asthma in Adults: After Your Visit.???    If you do not have an account, please click on the ???Sign Up Now??? link.     ?? 2006-2014 [employer]. Care instructions adapted under license by [facility] [place]. This care instruction is for use with your licensed healthcare professional. If you have questions about a medical condition or this instruction, always ask your healthcare professional. [employer], Inc[employer] disclaims any warranty or liability for your use of this information.  Content Version: 10.1.311062; Current as of: October 09, 2011              Asthma Action Plan: After Your Visit  Your Care Instructions  An asthma action plan is based on peak flow and asthma symptoms. Sorting symptoms and peak flow into red, yellow, and green "zones" can help you know how bad your asthma is and what actions you should take. Work with your doctor to make your plan. An action plan may include:  ?? The peak flow readings and symptoms for each zone.  ?? What medicines to take in each zone.  ?? When to call a doctor.  ?? A list of emergency contact numbers.  ?? A list of your asthma triggers.  Follow-up care is a key part of your treatment and safety. Be sure to make and go to all appointments, and call your doctor if you are having problems. It's also a  good idea to know your test results and keep a list of the medicines you take.  How can you care for yourself at home?  ?? Take your daily medicines to help minimize long-term damage and avoid asthma attacks.  ?? Check your peak flow every morning and evening. This is the best way to know how well your lungs are working.  ?? Check your action plan to see what zone you are in.  ?? If you are in the green zone, keep taking your daily asthma medicines as prescribed.  ?? If you are in the yellow zone, you may be having or will  soon have an asthma attack. You may not have any symptoms, but your lungs are not working as well as they should. Take the medicines listed in your action plan. If you stay in the yellow zone, your doctor may need to increase the dose or add a medicine.  ?? If you are in the red zone, follow your action plan. If your symptoms or peak flow don't improve soon, you may need to go to the emergency room or be admitted to the hospital.  ?? Use an asthma diary. Write down your peak flow readings in the asthma diary. If you have an attack, write down what caused it (if you know), the symptoms, and what medicine you took.  ?? Make sure you know how and when to call your doctor or go to the hospital.  ?? Take both the asthma action plan and the asthma diary--along with your peak flow meter and medicines--when you see your doctor. Tell your doctor if you are having trouble following your action plan.  When should you call for help?  Call 911 anytime you think you may need emergency care. For example, call if:  ?? You have severe trouble breathing.  Call your doctor now or seek immediate medical care if:  ?? Your symptoms do not get better after you have followed your asthma action plan.  ?? You cough up yellow, dark brown, or bloody mucus (sputum).  Watch closely for changes in your health, and be sure to contact your doctor if:  ?? Your coughing and wheezing get worse.  ?? You need to use quick-relief medicine on more  than 2 days a week (unless it is just for exercise).  ?? You need help figuring out what is triggering your asthma attacks.   Where can you learn more?   Go to [email or web address] and sign in to your MyChart account. Enter B511 in the Search Health Information box to learn more about ???Asthma Action Plan: After Your Visit.???    If you do not have an account, please click on the ???Sign Up Now??? link.     ?? 2006-2014 [employer], [employer]. Care instructions adapted under license by [facility]. This care instruction is for use with your licensed healthcare professional. If you have questions about a medical condition or this instruction, always ask your healthcare professional. [employer], Inc[employer] disclaims any warranty or liability for your use of this information.  Content Version: 10.0.273164; Last Revised: March 11, 2010              Controlling Your Asthma: After Your Visit  Your Care Instructions     Asthma is a long-term condition that affects your breathing. It causes the airways that lead to the lungs to swell. During an asthma attack, the airways swell and narrow. This makes it hard to breathe. You may wheeze or cough. If you have a bad attack, you may need emergency care.  There are two things to do to treat asthma.  ?? Control asthma over the long term.  ?? Treat attacks when they occur.  You and your doctor can make an asthma action plan. It tells you what medicines you need to take every day to control asthma symptoms and what to do if you have an asthma attack. Your asthma action plan can help prevent and treat attacks.  When you keep your asthma under control, you can prevent severe attacks and lasting damage to your airways. You need to treat your asthma even when you  are not having symptoms. Although asthma is a lifelong problem, you can still lead a full and active life.  Follow-up care is a key part of your treatment and safety. Be sure to make and go to all  appointments, and call your doctor if you are having problems. It's also a good idea to know your test results and keep a list of the medicines you take.  How can you care for yourself at home?  To control asthma over the long term  Medicines  Controller medicines reduce swelling in your lungs. They also prevent asthma attacks. Take your controller medicine exactly as prescribed. Talk to your doctor if you have any problems with your medicine.  ?? Inhaled corticosteroid is a common and effective controller medicine. Using it the right way can prevent or reduce most side effects.  ?? Take your controller medicine every day, not just when you have symptoms. It helps prevent problems before they occur.  ?? Your doctor may prescribe another medicine that you use along with the corticosteroid. This is often a long-acting bronchodilator. Do not take this medicine by itself. Using a long-acting bronchodilator by itself can increase your risk of a severe or fatal asthma attack.  ?? Do not take inhaled corticosteroids or long-acting bronchodilators to stop an asthma attack that has already started. They don't work fast enough to help.  ?? Talk to your doctor before you use other medicines. Some medicines, such as aspirin, can cause asthma attacks in some people.  Education  ?? Learn what triggers an asthma attack. Avoid these triggers when you can. Common triggers include colds, smoke, air pollution, dust, pollen, mold, pets, cockroaches, stress, and cold air.  ?? Learn how to use a peak flow meter. This checks how well you are breathing. It can help you know when an asthma attack is going to occur. Then you can take medicine to prevent the asthma attack or make it less severe.  ?? Do not smoke or allow others to smoke around you. Avoid smoky places. Smoking makes asthma worse. If you need help quitting, talk to your doctor about stop-smoking programs and medicines. These can increase your chances of quitting for good.  ?? Avoid  colds and the flu. Get a pneumococcal vaccine shot. If you have had one before, ask your doctor whether you need a second dose. Get a flu vaccine every year, as soon as it's available. If you must be around people with colds or the flu, wash your hands often.  To treat attacks when they occur  Use your asthma action plan when you have an attack. Your quick-relief medicine will stop an asthma attack. It relaxes the muscles that get tight around the airways.  If your doctor prescribed corticosteroid pills to use during an attack, take them as directed. They may take hours to work, but they may shorten the attack and help you breathe better.  ?? Albuterol is an effective quick-relief inhaler.  ?? Take your quick-relief medicine exactly as prescribed.  ?? Always bring your asthma medicine with you when you travel.  ?? You may need to use quick-relief medicine before you exercise.  ?? Call your doctor if you think you are having a problem with your medicine.  When should you call for help?  Call 911 anytime you think you may need emergency care. For example, call if:  ?? You are having severe trouble breathing.  Call your doctor now or seek immediate medical care if:  ??  Your symptoms do not get better after you have followed your asthma action plan.  ?? You cough up yellow, dark brown, or bloody mucus (sputum).  Watch closely for changes in your health, and be sure to contact your doctor if:  ?? Your coughing and wheezing get worse.  ?? You need to use your quick-relief medicine on more than 2 days a week (unless it is just for exercise).  ?? You need help figuring out what is triggering your asthma attacks.   Where can you learn more?   Go to [email or web address] and sign in to your MyChart account. Enter [phone] in the Search Health Information box to learn more about ???Controlling Your Asthma: After Your Visit.???    If you do not have an account, please click on the ???Sign Up Now??? link.     ?? 2006-2014 [employer]wise,  [employer]. Care instructions adapted under license by [facility]. This care instruction is for use with your licensed healthcare professional. If you have questions about a medical condition or this instruction, always ask your healthcare professional. Healthwise, Incorporated disclaims any warranty or liability for your use of this information.  Content Version: 10.1.311062; Current as of: October 09, 2011

## 2012-09-17 NOTE — Progress Notes (Signed)
Subjective:      Patient ID: Harold Gordon is a 52 y.o. male.    HPI  pt ran out of the breo and breathing keeps getting worse - rx will be ready around 4 pm today- SOB without exertion- no recent illness- productive cough of white phlegm- SOB started 4-5 days ago and getting worse- Albuterol use has increased 4 x today- normally does not use .cough is worse with deep breaths  Review of Systems    Objective:   Physical Exam   Constitutional: He is oriented to person, place, and time. Vital signs are normal. He appears well-nourished. He is active.   Pulmonary/Chest: No accessory muscle usage. No respiratory distress. He has wheezes in the right upper field, the right middle field, the right lower field, the left upper field, the left middle field and the left lower field.   Better air flow noted after breathing treatment- few scattered inspiratory wheezes noted in LML after breathing treatment   Neurological: He is alert and oriented to person, place, and time.   Psychiatric: He has a normal mood and affect. His speech is normal and behavior is normal. Judgment and thought content normal. Cognition and memory are normal.     Expected PEF: 491   PEF: 250-300-250 ( before tx)  PEF:300-350-300  ( after tx)  Assessment:      1. Asthma exacerbation    Plan:       1.PEF   2. Breathing tx x 1   3. BREO samples ( 3) given to pt- 1 puff qd   4. Asthma plan reviewed   5. Duration of visit 20 minutes- breathing treatment

## 2012-09-19 MED ORDER — HYDROCODONE-HOMATROPINE 5-1.5 MG/5ML PO SYRP
ORAL | Status: DC
Start: 2012-09-19 — End: 2013-06-25

## 2013-05-19 MED ORDER — FLUTICASONE FUROATE-VILANTEROL 100-25 MCG/INH IN AEPB
100-25 MCG/INH | RESPIRATORY_TRACT | Status: DC
Start: 2013-05-19 — End: 2013-06-25

## 2013-06-25 LAB — COMPREHENSIVE METABOLIC PANEL
ALT: 34 U/L (ref 10–40)
AST: 26 U/L (ref 15–37)
Albumin/Globulin Ratio: 1.7 (ref 1.1–2.2)
Albumin: 4.2 g/dL (ref 3.4–5.0)
Alkaline Phosphatase: 69 U/L (ref 40–129)
Anion Gap: 20 — ABNORMAL HIGH (ref 3–16)
BUN: 12 mg/dL (ref 7–20)
CO2: 19 mmol/L — ABNORMAL LOW (ref 21–32)
Calcium: 9 mg/dL (ref 8.3–10.6)
Chloride: 104 mmol/L (ref 99–110)
Creatinine: 0.9 mg/dL (ref 0.9–1.3)
GFR African American: >60 (ref >60)
GFR Non-African American: >60 (ref >60)
Globulin: 2.5 g/dL
Glucose: 95 mg/dL (ref 70–99)
Potassium: 4.6 mmol/L (ref 3.5–5.1)
Sodium: 143 mmol/L (ref 136–145)
Total Bilirubin: <0.2 mg/dL (ref 0.0–1.0)
Total Protein: 6.7 g/dL (ref 6.4–8.2)

## 2013-06-25 LAB — LIPID PANEL
Cholesterol, Total: 210 mg/dL — ABNORMAL HIGH (ref 0–199)
HDL: 82 mg/dL — ABNORMAL HIGH (ref 40–60)
LDL Calculated: 107 mg/dL — ABNORMAL HIGH (ref <100)
Triglycerides: 107 mg/dL (ref 0–150)
VLDL Cholesterol Calculated: 21 mg/dL

## 2013-06-25 LAB — VITAMIN D 25 HYDROXY: Vit D, 25-Hydroxy: 34.9 ng/mL (ref >=30)

## 2013-06-25 LAB — PSA PROSTATIC SPECIFIC ANTIGEN: PSA: 1 ng/mL (ref 0.00–4.00)

## 2013-06-25 MED ORDER — ALBUTEROL SULFATE HFA 108 (90 BASE) MCG/ACT IN AERS
108 (90 Base) MCG/ACT | Freq: Four times a day (QID) | RESPIRATORY_TRACT | Status: DC | PRN
Start: 2013-06-25 — End: 2015-08-17

## 2013-06-25 MED ORDER — FLUTICASONE FUROATE-VILANTEROL 100-25 MCG/INH IN AEPB
100-25 MCG/INH | RESPIRATORY_TRACT | Status: DC
Start: 2013-06-25 — End: 2014-07-16

## 2013-06-25 MED ORDER — IPRATROPIUM BROMIDE 0.06 % NA SOLN
0.06 % | Freq: Four times a day (QID) | NASAL | Status: DC
Start: 2013-06-25 — End: 2013-12-30

## 2013-06-25 NOTE — Patient Instructions (Signed)
See note

## 2013-06-25 NOTE — Progress Notes (Signed)
Subjective:      Patient ID: Harold Gordon is a 53 y.o. male.    HPI  Daily inhaler working well  Still coughing in am - good if able to get mucus up   Dry cough persists longer -   Not smoking cigars lately =- no allergy problems.  Rarely uses albuterol inhaler - uses if gets cough spell in afternoon.  Uses artificial sweetener - drinks beer 5-6/d - higher on weekends.  In [place] next weekend.  No pop - occ sweets  Allegra works well  nasocort didn't make any difference   Some doe - ? Deconditioning  Not a lot of exercise - walks a lot.  Walks 1/2 marathon - running gets winded quickly.  No cp/ palpitations  Seasonal pollen allergies positive on testing  Review of Systems  No ger/ gi sx  Nocturia x 1  No swelling/ msk issues  Objective:   Physical Exam   Constitutional: He is oriented to person, place, and time. He appears well-developed and well-nourished. No distress.   HENT:   Head: Normocephalic and atraumatic.   Mouth/Throat: Oropharynx is clear and moist. No oropharyngeal exudate.   Eyes: Conjunctivae are normal. No scleral icterus.   Neck: No thyromegaly present.   Cardiovascular: Normal rate, regular rhythm, normal heart sounds and intact distal pulses.    No murmur heard.  Pulmonary/Chest: Effort normal and breath sounds normal. No respiratory distress. He has no wheezes. He has no rales.   Abdominal: Soft. He exhibits no distension. There is no tenderness.   Musculoskeletal: He exhibits no edema.   Lymphadenopathy:     He has no cervical adenopathy.   Neurological: He is alert and oriented to person, place, and time.   Skin: Skin is warm and dry.   Psychiatric: He has a normal mood and affect.       Assessment:      1. Well adult exam     2. Asthma     3. Vitamin D deficiency  Vitamin D 25 Hydroxy   4. Environmental allergies     5. Hypercholesteremia  Lipid Panel    Comprehensive Metabolic Panel   6. Cough     7. Prostate cancer screening  PSA, Prostatic Specific Antigen   8. BPH (benign prostatic  hyperplasia)  PSA, Prostatic Specific Antigen   9. Excessive drinking alcohol     10. Fatty liver              Plan:      Add atrovent ns prn to allegra to see if helps w/ pnd - most likely cause of cough  Continue present meds - working well - refill as needed  Asthma seems to be stable congrats on avoiding cigars  Alcohol use excessive - d/w pt fatty liver changes  Check liver function w/ screening lipid/ psa and vit d  No significant bph changes but enlarged on exam in last year  tdap utd  Check labs today  Diet/ exercise d/w pt    utd on colonoscopy - done last year

## 2013-12-30 ENCOUNTER — Ambulatory Visit
Admit: 2013-12-30 | Discharge: 2013-12-30 | Payer: PRIVATE HEALTH INSURANCE | Attending: Family | Primary: Family Medicine

## 2013-12-30 DIAGNOSIS — L918 Other hypertrophic disorders of the skin: Secondary | ICD-10-CM

## 2013-12-30 NOTE — Progress Notes (Signed)
Subjective:      Patient ID: Harold KetoRobert L Gordon is a 53 y.o. male.    HPI    PT CO SPOT ON LEFT SIDE OF STOMACH, BEEN LESS THAN A YEAR, HAS AN APPT IN [place] WITH DERM, SPOT IS RED AND HARD, HAS BEEN RUBBED AND IS NOW IRRITATED, AND STARTED TO RIP OFF.     Review of Systems    Objective:   Physical Exam   Constitutional: He is oriented to person, place, and time.   Neurological: He is alert and oriented to person, place, and time.   Skin: Skin is warm and dry. There is erythema.   Left abdomen with small darkened skin tag - strangulated with erythema of the skin undelrying the lesion.        Assessment:      1. Inflamed skin tag             Plan:      Skin tag removed by scalpel.    Offered lidocaine, but declined  Covered with TAO and bandaid.

## 2014-03-06 NOTE — Telephone Encounter (Signed)
LVM FOR PT AND CALLED IN RX.KB

## 2014-03-06 NOTE — Telephone Encounter (Signed)
Please advise. Thanks! SG

## 2014-03-06 NOTE — Telephone Encounter (Signed)
[place].  Pt got sick yesterday.  Pt   Said he has cough, aches , not sure of fever. Sinus headaches. He has tried   Catering managerAlka seltzer plus, nyquil cold and flu.  And afrin.  Pt said he was not seen , but everyone at the clinic had the same symptoms.  Pt wants to know if you can call in tamiflu for him he leaves the hotel in one hour.    Phone number is-[phone]

## 2014-03-06 NOTE — Telephone Encounter (Signed)
Call in tamiflu 75mg  bid for 7 days.

## 2014-05-26 ENCOUNTER — Ambulatory Visit
Admit: 2014-05-26 | Discharge: 2014-05-26 | Payer: PRIVATE HEALTH INSURANCE | Attending: Family Medicine | Primary: Family Medicine

## 2014-05-26 DIAGNOSIS — E7800 Pure hypercholesterolemia, unspecified: Secondary | ICD-10-CM

## 2014-05-26 MED ORDER — PRAVASTATIN SODIUM 10 MG PO TABS
10 MG | ORAL_TABLET | Freq: Every day | ORAL | Status: DC
Start: 2014-05-26 — End: 2015-06-11

## 2014-05-26 NOTE — Progress Notes (Signed)
Subjective:      Patient ID: Harold Gordon is a 54 y.o. male.    HPI     PT HERE FOR ROUTINE FOLOW UP ON HYPERLIPIDEMIA AND MED REFILLS  Seeing [facility] - on dulera/ albuterol but doesn't use latter often  On flonase/ astelin w/ methylscopolamine - always congested  Cough persistent esp at night - some doe   meds had helped initially but not as effective now  Forgets pm meds a lot of time.  On pravachol 10/d for lipids  Wt Readings from Last 3 Encounters:   05/26/14 183 lb 3.2 oz (83.099 kg)   12/30/13 189 lb 6.4 oz (85.911 kg)   06/25/13 182 lb 6.4 oz (82.736 kg)     BP Readings from Last 3 Encounters:   05/26/14 124/80   12/30/13 130/84   06/25/13 114/72     Not real active on job  Diet so-so  No cp/ palpitations lately.  No dizzy /lightheaded  No ha  [place] - has eye appt scheduled soon.  Vision blurs when bp up. Worse vision distance and near - using cheaters  Tinnitus/ hearing loss chronic.  No gi/ gu problems - nocturia x1 - beer nightly - 6/ night.  1-2 coffee in am - rare pop  occ red bull  Back generally ok.  Recent 3 days binge drinking  Review of Systems    Objective:   Physical Exam   Constitutional: He is oriented to person, place, and time. He appears well-developed and well-nourished. No distress.   HENT:   Head: Normocephalic and atraumatic.   Mouth/Throat: Oropharynx is clear and moist. No oropharyngeal exudate.   Boggy uvula   Eyes: Conjunctivae are normal. No scleral icterus.   Neck: No thyromegaly present.   Cardiovascular: Normal rate, regular rhythm and normal heart sounds.    No murmur heard.  Pulmonary/Chest: Effort normal and breath sounds normal. No respiratory distress. He has no wheezes. He has no rales.   Abdominal: Soft. He exhibits no distension. There is no tenderness.   Musculoskeletal: He exhibits no edema.   Lymphadenopathy:     He has no cervical adenopathy.   Neurological: He is alert and oriented to person, place, and time.   Skin: Skin is warm and dry.    Psychiatric: He has a normal mood and affect.       Assessment:      1. Hypercholesteremia  Lipid Panel    Comprehensive Metabolic Panel   2. Mild persistent asthma with acute exacerbation     3. Environmental allergies     4. Tinnitus, subjective, bilateral     5. Cough     6. Excessive drinking alcohol              Plan:      Hydration encouraged for prolonged hangover  Alcohol use d/w pt  Diet/ exercise dw/ pt  Refill pravastatin  Check lipid,cmp   utd on colon/ prostate ca screen  utd on dtap    F/u allergist  Ct calcium score evaluation at request of patient

## 2014-05-26 NOTE — Patient Instructions (Signed)
note

## 2014-05-26 NOTE — Addendum Note (Signed)
Addended by: Julieta GuttingGREENE, STEPHANIE BURNETTE on: 05/26/2014 09:58 AM     Modules accepted: Orders

## 2014-05-27 LAB — COMPREHENSIVE METABOLIC PANEL
ALT: 84 U/L — ABNORMAL HIGH (ref 10–40)
AST: 75 U/L — ABNORMAL HIGH (ref 15–37)
Albumin/Globulin Ratio: 1.7 (ref 1.1–2.2)
Albumin: 4.8 g/dL (ref 3.4–5.0)
Alkaline Phosphatase: 75 U/L (ref 40–129)
Anion Gap: 17 — ABNORMAL HIGH (ref 3–16)
BUN: 12 mg/dL (ref 7–20)
CO2: 26 mmol/L (ref 21–32)
Calcium: 10 mg/dL (ref 8.3–10.6)
Chloride: 98 mmol/L — ABNORMAL LOW (ref 99–110)
Creatinine: 1.1 mg/dL (ref 0.9–1.3)
GFR African American: >60 (ref >60)
GFR Non-African American: >60 (ref >60)
Globulin: 2.8 g/dL
Glucose: 102 mg/dL — ABNORMAL HIGH (ref 70–99)
Potassium: 4.9 mmol/L (ref 3.5–5.1)
Sodium: 141 mmol/L (ref 136–145)
Total Bilirubin: 0.7 mg/dL (ref 0.0–1.0)
Total Protein: 7.6 g/dL (ref 6.4–8.2)

## 2014-05-27 LAB — LIPID PANEL
Cholesterol, Total: 241 mg/dL — ABNORMAL HIGH (ref 0–199)
HDL: 83 mg/dL — ABNORMAL HIGH (ref 40–60)
LDL Calculated: 116 mg/dL — ABNORMAL HIGH (ref <100)
Triglycerides: 212 mg/dL — ABNORMAL HIGH (ref 0–150)
VLDL Cholesterol Calculated: 42 mg/dL

## 2014-07-16 MED ORDER — BREO ELLIPTA 100-25 MCG/INH IN AEPB
100-25 MCG/INH | RESPIRATORY_TRACT | 10 refills | Status: DC
Start: 2014-07-16 — End: 2015-01-27

## 2014-07-22 ENCOUNTER — Inpatient Hospital Stay: Admit: 2014-07-22 | Attending: Family Medicine | Primary: Family Medicine

## 2014-07-22 DIAGNOSIS — Z7189 Other specified counseling: Secondary | ICD-10-CM

## 2015-01-27 ENCOUNTER — Ambulatory Visit
Admit: 2015-01-27 | Discharge: 2015-01-27 | Payer: PRIVATE HEALTH INSURANCE | Attending: Family | Primary: Family Medicine

## 2015-01-27 DIAGNOSIS — M7711 Lateral epicondylitis, right elbow: Secondary | ICD-10-CM

## 2015-01-27 LAB — LIPID PANEL
Cholesterol, Total: 205 mg/dL — ABNORMAL HIGH (ref 0–199)
HDL: 86 mg/dL — ABNORMAL HIGH (ref 40–60)
LDL Calculated: 105 mg/dL — ABNORMAL HIGH (ref <100)
Triglycerides: 72 mg/dL (ref 0–150)
VLDL Cholesterol Calculated: 14 mg/dL

## 2015-01-27 LAB — COMPREHENSIVE METABOLIC PANEL
ALT: 43 U/L — ABNORMAL HIGH (ref 10–40)
AST: 30 U/L (ref 15–37)
Albumin/Globulin Ratio: 1.7 (ref 1.1–2.2)
Albumin: 4.5 g/dL (ref 3.4–5.0)
Alkaline Phosphatase: 61 U/L (ref 40–129)
Anion Gap: 17 — ABNORMAL HIGH (ref 3–16)
BUN: 11 mg/dL (ref 7–20)
CO2: 22 mmol/L (ref 21–32)
Calcium: 9.2 mg/dL (ref 8.3–10.6)
Chloride: 102 mmol/L (ref 99–110)
Creatinine: 0.9 mg/dL (ref 0.9–1.3)
GFR African American: >60 (ref >60)
GFR Non-African American: >60 (ref >60)
Globulin: 2.6 g/dL
Glucose: 104 mg/dL — ABNORMAL HIGH (ref 70–99)
Potassium: 4.9 mmol/L (ref 3.5–5.1)
Sodium: 141 mmol/L (ref 136–145)
Total Bilirubin: 0.5 mg/dL (ref 0.0–1.0)
Total Protein: 7.1 g/dL (ref 6.4–8.2)

## 2015-01-27 LAB — HEPATITIS C ANTIBODY: Hep C Ab Interp: NONREACTIVE

## 2015-01-27 MED ORDER — FLUTICASONE PROPIONATE HFA 220 MCG/ACT IN AERO
220 MCG/ACT | Freq: Two times a day (BID) | RESPIRATORY_TRACT | 11 refills | Status: DC
Start: 2015-01-27 — End: 2016-02-17

## 2015-01-27 NOTE — Progress Notes (Signed)
Subjective:      Patient ID: Harold Gordon is a 55 y.o. male.  CC: In for labs - cardiologist wants cholesterol tested again prior to visit. Had elevated LFTs.      HPI     TENDONITIS BOTH ELBOWS, PAIN GETS WORSE AT NIGHT OR TRYING TO LIFT ANYTHING   Has had tendonitis for 3-4 months.    Has not tried anything other than occasional aleve or advil.  Not helping much.    Left is worst today - but generally right is worse than left.  Has had past steroid injection in the right. Did work in the past. Prior to 2000.   Rotation at the elbow worsens pain, and flexion at the elbow against resistance worsens pain.   Not swollen.   Saw Bernstein for allergies.    Had calcium scoring.     Chronic cough - had mild asthma taking breo    BLOOD WORK     Review of Systems    Objective:   Physical Exam   Constitutional: He is oriented to person, place, and time. He appears well-developed and well-nourished. No distress.   Cardiovascular: Normal rate, regular rhythm and normal heart sounds.  Exam reveals no gallop and no friction rub.    No murmur heard.  Pulmonary/Chest: Effort normal and breath sounds normal. No respiratory distress. He has no wheezes. He has no rales.   Musculoskeletal: He exhibits tenderness.   Bilateral tennis elbow - pain with palpation over lateral epicondyles.   ROM full.  Has pain with flexion against resistance and with deep pronation at the wrist. Pain down into the muscles of the forearm.    Neurological: He is alert and oriented to person, place, and time.   Skin: Skin is warm and dry. He is not diaphoretic.   Psychiatric: He has a normal mood and affect. Judgment normal.       Assessment:      1. Bilateral tennis elbow     2. Elevated LFTs  Hepatitis C Antibody    Comprehensive Metabolic Panel   3. Hypercholesteremia  Comprehensive Metabolic Panel    Lipid Panel   4. Mild persistent asthma with acute exacerbation  fluticasone (FLOVENT HFA) 220 MCG/ACT inhaler         Plan:      Labs ordered.   Back  down from breo to Flovent as asthma is well controlled.   Injected bilateral elbows with 1/4 cc depomedrol, 1/4 cc marcaine without difficulty for tennis elbow.   Return if symptoms worsen or fail to improve, for and Routine follow up for chronic conditions..      Patient Instructions     Tennis Elbow: Care Instructions  Your Care Instructions  Tennis elbow is soreness or pain on the outer part of the elbow. The pain occurs when the tendon is stretched and becomes irritated by repeated twisting of the hand, wrist, and forearm. A tendon is a tough tissue that connects muscle to bone. This injury is common in tennis players. But you also can get it from many activities that work the same muscles. Examples include gardening, painting, and using tools.  Tennis elbow usually heals with rest and treatment at home.  Follow-up care is a key part of your treatment and safety. Be sure to make and go to all appointments, and call your doctor if you are having problems. It's also a good idea to know your test results and keep a list of the medicines  you take.  How can you care for yourself at home?  ?? Rest your fingers, wrist, and forearm. Try to stop or reduce any activity that causes elbow pain. You may have to rest your arm for weeks to months. Follow your doctor's directions for how long to rest.  ?? Put ice or a cold pack on your elbow for 10 to 20 minutes at a time. Try to do this every 1 to 2 hours for the next 3 days (when you are awake) or until the swelling goes down. Put a thin cloth between the ice and your skin.  ?? If your doctor gave you a brace or splint, use it as directed. A "counterforce" brace is a strap around your forearm, just below your elbow. It may ease the pressure on the tendon and spread force throughout your arm.  ?? Prop up your elbow on pillows to help reduce swelling.  ?? Follow your doctor's or physical therapist's directions for exercise.  ?? Return to your usual activities slowly.  ?? Try to  prevent the problem. Learn the best techniques for your sport. For example, make sure the grip on your tennis racquet is not too big for your hand. Try not to hit a tennis ball late in your swing.  ?? Think about asking your employer about new ways of doing your job if your elbow pain is caused by something you do at work.  Medicines  ?? Be safe with medicines. Read and follow all instructions on the label.  ?? If the doctor gave you a prescription medicine for pain, take it as prescribed.  ?? If you are not taking a prescription pain medicine, ask your doctor if you can take an over-the-counter medicine.  When should you call for help?  Call your doctor now or seek immediate medical care if:  ?? Your pain is worse.  ?? You cannot bend your elbow normally.  ?? Your arm or hand is cool or pale or changes color.  ?? You have tingling, weakness, or numbness in your hand and fingers.  Watch closely for changes in your health, and be sure to contact your doctor if:  ?? You have work problems caused by your elbow pain.  ?? Your pain is not better after 2 weeks.  Where can you learn more?  Go to [email or web address] and sign in to your MyChart account. Enter C285 in the Search Health Information box to learn more about ???Tennis Elbow: Care Instructions.???    If you do not have an account, please click on the ???Sign Up Now??? link.  ?? 2006-2016 [employer]. Care instructions adapted under license by [facility]. This care instruction is for use with your licensed healthcare professional. If you have questions about a medical condition or this instruction, always ask your healthcare professional. Healthwise, Inc[employer] disclaims any warranty or liability for your use of this information.  Content Version: 11.0.578772; Current as of: May 25, 2014        Tennis Elbow: Exercises  Your Care Instructions  Here are some examples of typical rehabilitation exercises for your condition. Start each exercise slowly.  Ease off the exercise if you start to have pain.  Your doctor or physical therapist will tell you when you can start these exercises and which ones will work best for you.  How to do the exercises  Wrist flexor stretch    1. Extend your arm in front of you with your palm up.  2. Dawson your  wrist, pointing your hand toward the floor.  3. With your other hand, gently bend your wrist farther until you feel a mild to moderate stretch in your forearm.  4. Hold for at least 15 to 30 seconds. Repeat 2 to 4 times.  Wrist extensor stretch    Repeat steps 1 to 4 of the stretch above but begin with your extended hand palm down.  Ball or sock squeeze    1. Hold a tennis ball (or a rolled-up sock) in your hand.  2. Make a fist around the ball (or sock) and squeeze.  3. Hold for about 6 seconds, and then relax for up to 10 seconds.  4. Repeat 8 to 12 times.  5. Switch the ball (or sock) to your other hand and do 8 to 12 times.  Wrist deviation    1. Sit so that your arm is supported but your hand hangs off the edge of a flat surface, such as a table.  2. Hold your hand out like you are shaking hands with someone.  3. Move your hand up and down.  4. Repeat this motion 8 to 12 times.  5. Switch arms.  6. Try to do this exercise twice with each hand.  Wrist curls    1. Place your forearm on a table with your hand hanging over the edge of the table, palm up.  2. Place a 1- to 2-pound weight in your hand. This may be a dumbbell, a can of food, or a filled water bottle.  3. Slowly raise and lower the weight while keeping your forearm on the table and your palm facing up.  4. Repeat this motion 8 to 12 times.  5. Switch arms, and do steps 1 through 4.  6. Repeat with your hand facing down toward the floor. Switch arms.  Biceps curls    1. Sit leaning forward with your legs slightly spread and your left hand on your left thigh.  2. Place your right elbow on your right thigh, and hold the weight with your forearm horizontal.  3. Slowly  curl the weight up and toward your chest.  4. Repeat this motion 8 to 12 times.  5. Switch arms, and do steps 1 through 4.  Follow-up care is a key part of your treatment and safety. Be sure to make and go to all appointments, and call your doctor if you are having problems. It's also a good idea to know your test results and keep a list of the medicines you take.  Where can you learn more?  Go to [email or web address] and sign in to your MyChart account. Enter [phone] in the Search Health Information box to learn more about ???Tennis Elbow: Exercises.???    If you do not have an account, please click on the ???Sign Up Now??? link.  ?? 2006-2016 [employer], [employer]. Care instructions adapted under license by [facility]. This care instruction is for use with your licensed healthcare professional. If you have questions about a medical condition or this instruction, always ask your healthcare professional. Healthwise, Incorporated disclaims any warranty or liability for your use of this information.  Content Version: 11.0.578772; Current as of: May 25, 2014

## 2015-01-27 NOTE — Patient Instructions (Signed)
Tennis Elbow: Care Instructions  Your Care Instructions  Tennis elbow is soreness or pain on the outer part of the elbow. The pain occurs when the tendon is stretched and becomes irritated by repeated twisting of the hand, wrist, and forearm. A tendon is a tough tissue that connects muscle to bone. This injury is common in tennis players. But you also can get it from many activities that work the same muscles. Examples include gardening, painting, and using tools.  Tennis elbow usually heals with rest and treatment at home.  Follow-up care is a key part of your treatment and safety. Be sure to make and go to all appointments, and call your doctor if you are having problems. It's also a good idea to know your test results and keep a list of the medicines you take.  How can you care for yourself at home?  ?? Rest your fingers, wrist, and forearm. Try to stop or reduce any activity that causes elbow pain. You may have to rest your arm for weeks to months. Follow your doctor's directions for how long to rest.  ?? Put ice or a cold pack on your elbow for 10 to 20 minutes at a time. Try to do this every 1 to 2 hours for the next 3 days (when you are awake) or until the swelling goes down. Put a thin cloth between the ice and your skin.  ?? If your doctor gave you a brace or splint, use it as directed. A "counterforce" brace is a strap around your forearm, just below your elbow. It may ease the pressure on the tendon and spread force throughout your arm.  ?? Prop up your elbow on pillows to help reduce swelling.  ?? Follow your doctor's or physical therapist's directions for exercise.  ?? Return to your usual activities slowly.  ?? Try to prevent the problem. Learn the best techniques for your sport. For example, make sure the grip on your tennis racquet is not too big for your hand. Try not to hit a tennis ball late in your swing.  ?? Think about asking your employer about new ways of doing your job if your elbow pain is caused  by something you do at work.  Medicines  ?? Be safe with medicines. Read and follow all instructions on the label.  ?? If the doctor gave you a prescription medicine for pain, take it as prescribed.  ?? If you are not taking a prescription pain medicine, ask your doctor if you can take an over-the-counter medicine.  When should you call for help?  Call your doctor now or seek immediate medical care if:  ?? Your pain is worse.  ?? You cannot bend your elbow normally.  ?? Your arm or hand is cool or pale or changes color.  ?? You have tingling, weakness, or numbness in your hand and fingers.  Watch closely for changes in your health, and be sure to contact your doctor if:  ?? You have work problems caused by your elbow pain.  ?? Your pain is not better after 2 weeks.  Where can you learn more?  Go to [email or web address] and sign in to your MyChart account. Enter C285 in the Search Health Information box to learn more about ???Tennis Elbow: Care Instructions.???    If you do not have an account, please click on the ???Sign Up Now??? link.  ?? 2006-2016 [employer]wise, [employer]. Care instructions adapted under license by [facility]. This care instruction is   for use with your licensed healthcare professional. If you have questions about a medical condition or this instruction, always ask your healthcare professional. Healthwise, Incorporated disclaims any warranty or liability for your use of this information.  Content Version: 11.0.578772; Current as of: May 25, 2014        Tennis Elbow: Exercises  Your Care Instructions  Here are some examples of typical rehabilitation exercises for your condition. Start each exercise slowly. Ease off the exercise if you start to have pain.  Your doctor or physical therapist will tell you when you can start these exercises and which ones will work best for you.  How to do the exercises  Wrist flexor stretch    1. Extend your arm in front of you with your palm up.  2. Bend your  wrist, pointing your hand toward the floor.  3. With your other hand, gently bend your wrist farther until you feel a mild to moderate stretch in your forearm.  4. Hold for at least 15 to 30 seconds. Repeat 2 to 4 times.  Wrist extensor stretch    Repeat steps 1 to 4 of the stretch above but begin with your extended hand palm down.  Ball or sock squeeze    1. Hold a tennis ball (or a rolled-up sock) in your hand.  2. Make a fist around the ball (or sock) and squeeze.  3. Hold for about 6 seconds, and then relax for up to 10 seconds.  4. Repeat 8 to 12 times.  5. Switch the ball (or sock) to your other hand and do 8 to 12 times.  Wrist deviation    1. Sit so that your arm is supported but your hand hangs off the edge of a flat surface, such as a table.  2. Hold your hand out like you are shaking hands with someone.  3. Move your hand up and down.  4. Repeat this motion 8 to 12 times.  5. Switch arms.  6. Try to do this exercise twice with each hand.  Wrist curls    1. Place your forearm on a table with your hand hanging over the edge of the table, palm up.  2. Place a 1- to 2-pound weight in your hand. This may be a dumbbell, a can of food, or a filled water bottle.  3. Slowly raise and lower the weight while keeping your forearm on the table and your palm facing up.  4. Repeat this motion 8 to 12 times.  5. Switch arms, and do steps 1 through 4.  6. Repeat with your hand facing down toward the floor. Switch arms.  Biceps curls    1. Sit leaning forward with your legs slightly spread and your left hand on your left thigh.  2. Place your right elbow on your right thigh, and hold the weight with your forearm horizontal.  3. Slowly curl the weight up and toward your chest.  4. Repeat this motion 8 to 12 times.  5. Switch arms, and do steps 1 through 4.  Follow-up care is a key part of your treatment and safety. Be sure to make and go to all appointments, and call your doctor if you are having problems. It's also a good  idea to know your test results and keep a list of the medicines you take.  Where can you learn more?  Go to [email or web address] and sign in to your MyChart account. Enter U616 in the Search Health Information box   to learn more about ???Tennis Elbow: Exercises.???    If you do not have an account, please click on the ???Sign Up Now??? link.  ?? 2006-2016 Healthwise, Inc[employer]. Care instructions adapted under license by [facility]. This care instruction is for use with your licensed healthcare professional. If you have questions about a medical condition or this instruction, always ask your healthcare professional. Healthwise, Incorporated disclaims any warranty or liability for your use of this information.  Content Version: 11.0.578772; Current as of: May 25, 2014

## 2015-01-29 MED ORDER — METHYLPREDNISOLONE ACETATE 80 MG/ML IJ SUSP
80 MG/ML | Freq: Once | INTRAMUSCULAR | Status: DC
Start: 2015-01-29 — End: 2017-05-18

## 2015-05-03 NOTE — Telephone Encounter (Signed)
We can give it a try

## 2015-05-03 NOTE — Telephone Encounter (Signed)
PT COMING IN ON Wednesday.KW

## 2015-05-03 NOTE — Telephone Encounter (Signed)
CALLED PT AND SHE SAID THIS HAPPENED LAST Wednesday AND THEY ARE NOT INFECTED OR RED THEY JUST NEED TO BE PULLED OUT. HE SAID YOU CAN SEE THE BLACK SPOTS WHERE THEY WENT IN. HE IS NOT WANTING TO GO TO THE HOSPITAL. KW

## 2015-05-03 NOTE — Telephone Encounter (Signed)
He stepped on  Sea urchin    And has it's thorns stuck in his heel.  (like a porcupine)  Every time he steps on his heel he pushes them back in again.. It's not painful right now - he just wants them out..   Any suggestions on what to do?    Pt @  [phone]

## 2015-05-05 ENCOUNTER — Ambulatory Visit
Admit: 2015-05-05 | Discharge: 2015-05-05 | Payer: PRIVATE HEALTH INSURANCE | Attending: Family Medicine | Primary: Family Medicine

## 2015-05-05 DIAGNOSIS — S90852A Superficial foreign body, left foot, initial encounter: Secondary | ICD-10-CM

## 2015-05-05 NOTE — Progress Notes (Signed)
Here for removal of sea urchin needles in foot - about 10 of them - pt can see well  No swelling/ pain after about a half hour - just annoyed as can feel little things sticking out bottom of heel  PE: 10 small hair like structures in heel - multiple ones removed but most removed in part - as break off easily and base still in skin -no inflammation   A/p - foreign body - urchin needles in foot - scraped and removed parts of most - several in entirety  Keep clean - soak foot and recommend pumice to bottom of heel  Recheck in next several weeks and try to get remainder out after time to grow out  Pt tolerated well  Alcohol prep used

## 2015-05-05 NOTE — Patient Instructions (Signed)
note

## 2015-06-01 ENCOUNTER — Ambulatory Visit
Admit: 2015-06-01 | Discharge: 2015-06-01 | Payer: PRIVATE HEALTH INSURANCE | Attending: Surgical | Primary: Family Medicine

## 2015-06-01 ENCOUNTER — Ambulatory Visit: Admit: 2015-06-01 | Payer: PRIVATE HEALTH INSURANCE | Primary: Family Medicine

## 2015-06-01 DIAGNOSIS — M5136 Other intervertebral disc degeneration, lumbar region: Secondary | ICD-10-CM

## 2015-06-01 NOTE — Telephone Encounter (Signed)
Faxed order to [facility] [phone]

## 2015-06-01 NOTE — Progress Notes (Signed)
History of present illness:   Mr. Harold Gordon is a pleasant 55 y.o. old male with a PMH of asthma kindly referred by Dr. Army Melia for consultation regarding Mr. Harold Gordon's neck pain. He states the pain began insidiously about 3 yeaars ago. His pain has waxed and waned since then. He rates his neck pain 5/10 and shoulder and arm pain 0/10. He describes the pain as aching and intermittent. He notes an intermittent pain/twinge in his neck with cervical flexion or extension, or when coughing or sneezing. He denies upper extremity pain, numbness or weakness currently. He notes his reason for his visit today is that he developed upper and lower extremity burning sensation approximately 2 weeks ago that lasted for 1.5 weeks. He states the burning/tingling in his arms and legs were equal and constant, but have since subsided on its own. He denies loss of fine motor control in his hands. He denies, lower extremity symptoms, gait abnormality and bowel or bladder dysfunction. The pain occasionally interferes with his sleep. He has not had any recent treatment for his neck pain. He takes NSAIDs prn.       Past medical history:   His past medical history has been reviewed and is significant for asthma.    Past surgical history:   His past surgical history has been reviewed and is non-contributory to his present illness.     His  medications and allergies were reviewed.     Social history:   His social history has been reviewed and he is a former smoker.     Review of symptoms:   His review of symptoms was reviewed and is significant for neck pain and negative for recent weight loss, fatigue, chills, night sweats, blood in stool or urine, recent infection, chest pain, visual disturbance, or shortness of breath. All other ROS were negative.    Physical examination:   Mr. Harold Gordon most recent vitals:  Vitals  BP: (!) 167/99  Pulse: 76  Height:  (172.7 cm)  Weight: 176 lb (79.8 kg)  Body mass index is 26.76  kg/(m^2).    General Exam:  He is well-developed and well-nourished, is in obvious pain and alert and oriented to person, place, and time. He demonstrates appropriate mood and affect. He walks with a normal gait.    HEENT:   His cervical flexion, extension, and axial rotation are moderately reduced with pain. His skin is warm and dry.He has no tenderness over his cervical spine and no obvious muscle spasm. The skin over his cervical spine is normal without surgical scar.     Upper extremities:  He has 5/5 strength of his interosseous muscles, wrist dorsiflexors and volarflexors, biceps, triceps, deltoids, and internal and external rotators of his shoulders, bilaterally. His biceps, triceps, bracheoradialis, quadriceps and achilles reflexes are 2+, bilaterally. Sensation is intact to light touchfrom C6 to C8. He has no clonus and negative Hoffman's bilaterally.     Lower extremities:  Normal DTR knees, no clonus.    Imaging:   AP and lateral xray images of his cervical spine were obtained in the office today and independently reviewed. They show cervical spondylosis, most pronounced C4-C7. No acute fracture or dislocation noted.     Assessment:   Cervical DDD    Plan:   We discussed treatment options including observation, epidural injections, physical therapy and anterior cervical discectomy and fusion.    I recommend we obtain an MRI of his cervical spine, as his  cervical xrays today appear significantly more progressed than his last MRI in 2013. We will call him with his MRI results.    Note dictated by Jolene ProvostKelly Ashliegh Parekh PA-C, patient also seen and examined by Dr. Sande BrothersFerree.

## 2015-06-11 MED ORDER — PRAVASTATIN SODIUM 10 MG PO TABS
10 MG | ORAL_TABLET | ORAL | 11 refills | Status: DC
Start: 2015-06-11 — End: 2016-05-15

## 2015-06-11 NOTE — Telephone Encounter (Signed)
LAST APT 05/05/15 NO APT SCHEDULED.

## 2015-06-15 ENCOUNTER — Inpatient Hospital Stay: Admit: 2015-06-15 | Attending: Orthopaedic Surgery of the Spine | Primary: Family Medicine

## 2015-06-15 DIAGNOSIS — M5136 Other intervertebral disc degeneration, lumbar region: Secondary | ICD-10-CM

## 2015-06-16 NOTE — Telephone Encounter (Signed)
-----   Message from Nadene RubinsBret A Ferree, MD sent at 06/16/2015  9:16 AM EDT -----  No cord compression. PT should help and would be best. Could also try chiro. Alternatively could see Reinaldo RaddleMegan, Aarti or Singla to see if injections could help.

## 2015-06-16 NOTE — Telephone Encounter (Signed)
Spoke with patient and let him know results.  He said he would call back to talk about PT and ESI.

## 2015-08-18 MED ORDER — PROVENTIL HFA 108 (90 BASE) MCG/ACT IN AERS
108 (90 Base) MCG/ACT | RESPIRATORY_TRACT | 1 refills | Status: AC
Start: 2015-08-18 — End: ?

## 2015-08-18 NOTE — Telephone Encounter (Signed)
LAST APPT 05/05/15  NO FOLLOW UP SCHEDULED

## 2016-01-28 ENCOUNTER — Ambulatory Visit
Admit: 2016-01-28 | Discharge: 2016-01-28 | Payer: PRIVATE HEALTH INSURANCE | Attending: Family | Primary: Family Medicine

## 2016-01-28 DIAGNOSIS — M436 Torticollis: Secondary | ICD-10-CM

## 2016-01-28 MED ORDER — METHOCARBAMOL 500 MG PO TABS
500 MG | ORAL_TABLET | Freq: Four times a day (QID) | ORAL | 0 refills | Status: AC | PRN
Start: 2016-01-28 — End: 2016-02-07

## 2016-01-28 MED ORDER — PREDNISONE 10 MG PO TABS
10 MG | ORAL_TABLET | ORAL | 0 refills | Status: DC
Start: 2016-01-28 — End: 2016-05-15

## 2016-01-28 NOTE — Patient Instructions (Signed)
Patient Education        Torticollis: Care Instructions  Your Care Instructions  Torticollis is a severe tightness of the muscles on one side of the neck. The tight muscles can make the head turn to one side, lean to one side, or be pulled forward or backward. It is also called wryneck.  Your doctor asked questions about your health and examined you. You may also have had X-rays or other tests. If your doctor thinks another medical problem is causing your tight neck muscles, you may need more tests.  Torticollis usually gets better with home care. Your doctor may have you take medicine to relieve pain or relax your muscles. He or she may suggest exercise and physical therapy to help increase flexibility and relieve stress. Your doctor may also have you wear a special collar, called a cervical collar, for a day or two. The collar may help make your neck more comfortable.  Follow-up care is a key part of your treatment and safety. Be sure to make and go to all appointments, and call your doctor if you are having problems. It's also a good idea to know your test results and keep a list of the medicines you take.  How can you care for yourself at home?  ?? Be safe with medicines. Read and follow all instructions on the label.  ?? If the doctor gave you a prescription medicine for pain, take it as prescribed.  ?? If you are not taking a prescription pain medicine, ask your doctor if you can take an over-the-counter medicine.  ?? Try using a heating pad on a low or medium setting for 15 to 20 minutes every 2 or 3 hours. Try a warm shower in place of one session with the heating pad.  ?? Try using an ice pack for 10 to 15 minutes every 2 to 3 hours. Put a thin cloth between the ice and your skin.  ?? If your doctor recommends a cervical collar, wear it exactly as directed.  When should you call for help?  Call your doctor now or seek immediate medical care if:  ? ?? You have new or worse numbness in your arms, buttocks, or  legs.   ? ?? You have new or worse weakness in your arms or legs.   ? ?? Your neck pain gets worse.   ? ?? You lose bladder or bowel control.   ?Watch closely for changes in your health, and be sure to contact your doctor if:  ? ?? You do not get better as expected.   Where can you learn more?  Go to [email or web address] and sign in to your MyChart account. Enter A938 in the Search Health Information box to learn more about "Torticollis: Care Instructions."     If you do not have an account, please click on the "Sign Up Now" link.  Current as of: March 23, 2015  Content Version: 11.5  ?? 2006-2017 [employer]. Care instructions adapted under license by [facility]. If you have questions about a medical condition or this instruction, always ask your healthcare professional. [employer] disclaims any warranty or liability for your use of this information.

## 2016-01-28 NOTE — Progress Notes (Signed)
Subjective:      Patient ID: Harold Gordon is a 56 y.o. male.    HPI     Chief Complaint   Patient presents with   ??? Neck Pain     HURTS DOWN TO LEFT SHOULDER /2 DAYS   neck pain for the last two days- dull constant pain worse with moving head ,sneezing or coughing - no injury that he knows ofhe has tired lidoderm patches that seemed to help rates 6/10 was an 11/10 yesterday - pain was in arm and shoulder blade 1/25 not as bad today  Review of Systems   Musculoskeletal: Positive for myalgias and neck pain.   All other systems reviewed and are negative.      Objective:   Physical Exam   Constitutional: Vital signs are normal. He appears well-developed and well-nourished. He is active.   Neck: Muscular tenderness present. Decreased range of motion present.       Slight tenderness noted with palpation of SCM into clavicular area left side    DROM noted with rotation and flexion     Neurological: He is alert.   Psychiatric: He has a normal mood and affect. His speech is normal and behavior is normal. Judgment and thought content normal. Cognition and memory are normal.       Assessment:       1. Torticollis, acute           Plan:      Molly MaduroRobert was seen today for neck pain.    Diagnoses and all orders for this visit:    Torticollis, acute  -     predniSONE (DELTASONE) 10 MG tablet; 4 tabs x 2 d 3 tabs x 2 d 2 tabs x 2 d 1  tab x 2 d in am with food  avoid NSAIDs while on this medication  -     methocarbamol (ROBAXIN) 500 MG tablet; Take 1 tablet by mouth 4 times daily as needed (spasms)  Pt encouraged to continue use of lidoderm patches- as well as icing on 15 min 4 x daily  Stretches reviewed with pt as well - encouraged to do at least 4 x daily  If pain persists or worsens he is instructed to follow up with pcp

## 2016-02-17 ENCOUNTER — Encounter

## 2016-02-17 NOTE — Telephone Encounter (Signed)
LAST APPT 01/27/15  NEXT APPT 03/09/16

## 2016-02-18 MED ORDER — FLOVENT HFA 220 MCG/ACT IN AERO
220 MCG/ACT | Freq: Two times a day (BID) | RESPIRATORY_TRACT | 8 refills | Status: DC
Start: 2016-02-18 — End: 2016-05-15

## 2016-03-09 ENCOUNTER — Ambulatory Visit
Admit: 2016-03-09 | Discharge: 2016-03-09 | Payer: PRIVATE HEALTH INSURANCE | Attending: Family Medicine | Primary: Family Medicine

## 2016-03-09 DIAGNOSIS — Z0000 Encounter for general adult medical examination without abnormal findings: Secondary | ICD-10-CM

## 2016-03-09 MED ORDER — DICLOFENAC SODIUM 1 % TD GEL
1 % | Freq: Four times a day (QID) | TRANSDERMAL | 0 refills | Status: DC
Start: 2016-03-09 — End: 2016-04-09

## 2016-03-09 MED ORDER — CETIRIZINE HCL 10 MG PO TABS
10 MG | ORAL_TABLET | Freq: Every day | ORAL | 0 refills | Status: AC
Start: 2016-03-09 — End: ?

## 2016-03-09 MED ORDER — UMECLIDINIUM-VILANTEROL 62.5-25 MCG/INH IN AEPB
RESPIRATORY_TRACT | 5 refills | Status: DC
Start: 2016-03-09 — End: 2016-05-15

## 2016-03-09 MED ORDER — UMECLIDINIUM-VILANTEROL 62.5-25 MCG/INH IN AEPB
Freq: Every day | RESPIRATORY_TRACT | 2 refills | Status: DC
Start: 2016-03-09 — End: 2016-05-15

## 2016-03-09 NOTE — Progress Notes (Signed)
Subjective:      Patient ID: [name] is a 56 y.o. male.    HPI       History and Physical      Harold Gordon  Date of Birth:  Jan 05, 1960    Date of Service:  03/09/2016    Chief Complaint:   Harold Gordon is a 56 y.o. male who presents for complete physical examination.  Chief Complaint   Patient presents with   . Annual Exam     ANNUAL ROUTINE PHYSICAL FASTING LABS NOT INTERSETED IN IMMUNIZATIONS    pain left upper back - sharp pain back of neck down left arm - aggravated recently jumping on air mattress.  Pain trap area left neck - dull pain -no numbness/ tingling.  No obvious spasm - disrupts sleep - uncomfortable laying on stomach.  Seen by cnp 2 months ago - steroids no help  Morning cough for long time - no response to allergy treatment.  Switched to zyrtec - allegra doesn't work in [employer] - zyrtec works better - bought house in [place] daily - spits up a lot of mucus  Has albuterol - uses 2-3x/ month - when gets up he's fine but after 10-15 minutes bad cough - eventually gags if unable to get mucus up - fine rest of day after gets mucus up.  Still coughing up a lot of mucus in am.  Took flonase for 3 months w/o improvement.  Smoked for 25 -30 pack years in past.  Winded somewhat if real exertional  Not as active as in past - some walking at work but no regular exercise.  Gets tremor in hand - quivering in heart.  No fam hx of pd/ tremors  Can notice twitching at rest as well as w/ intention - not all the time - but can be worse off and on.  No movement issues - not great memory.  No aggravating/ alleviating factor - drinks 1 coffee in am - occ 2  Mostly water - drinks etoh - 6-8 beers/ day avg.  Not sleeping as well lately - good last pm - varies.  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  Back bothersome from time to time - stretches periodically/ inversion  Some oa in hands - index to ring fingers tight / stiff at  times.  No swelling - no cp/palp/ dizzy/ lightheaded  No gi problems - bm's regular -no gerd/ nausea  Vision/ hearing ok - tinnitus chronic - vision stable -readers      Current Outpatient Prescriptions   Medication Sig Dispense Refill   . FLOVENT HFA 220 MCG/ACT inhaler INHALE 2 PUFFS INTO THE LUNGS 2 TIMES DAILY 12 Inhaler 8   . PROVENTIL HFA 108 (90 Base) MCG/ACT inhaler INHALE 2 PUFFS INTO THE LUNGS EVERY 6 HOURS AS NEEDED 6.7 Inhaler 1   . pravastatin (PRAVACHOL) 10 MG tablet TAKE 1 TABLET BY MOUTH DAILY 30 tablet 11   . aspirin 81 MG tablet Take 81 mg by mouth daily     . KRILL OIL PO Take 1 capsule by mouth daily.     . predniSONE (DELTASONE) 10 MG tablet 4 tabs x 2 d 3 tabs x 2 d 2 tabs x 2 d 1  tab x 2 d in am with food  avoid NSAIDs while on this medication 20 tablet 0   . fexofenadine (ALLEGRA  ALLERGY) 180 MG tablet Take 180 mg by mouth daily.       Current Facility-Administered Medications   Medication Dose Route Frequency Provider Last Rate Last Dose   . methylPREDNISolone acetate (DEPO-MEDROL) injection 20 mg  20 mg Intra-articular Once Dorena Dew, CNP       . methylPREDNISolone acetate (DEPO-MEDROL) injection 20 mg  20 mg Intra-articular Once Dorena Dew, CNP           HPI: well adult exam  BP Readings from Last 3 Encounters:   03/09/16 120/78   01/28/16 130/76   06/01/15 (!) 167/99     Pulse Readings from Last 3 Encounters:   03/09/16 74   01/28/16 94   06/01/15 76     Wt Readings from Last 3 Encounters:   03/09/16 183 lb (83 kg)   01/28/16 181 lb (82.1 kg)   06/01/15 176 lb (79.8 kg)         Wt Readings from Last 3 Encounters:   01/28/16 181 lb (82.1 kg)   06/01/15 176 lb (79.8 kg)   05/05/15 174 lb (78.9 kg)     BP Readings from Last 3 Encounters:   01/28/16 130/76   06/01/15 (!) 167/99   05/05/15 124/70       Patient Active Problem List   Diagnosis   . Palpitations   . Hypercholesteremia   . DDD (degenerative disc disease), lumbar   . Asthma   . Environmental allergies   .  Perennial sinusitis   . Post-nasal drip   . Cough   . Sinusitis, chronic   . Tinnitus, subjective   . Hearing loss of both ears   . Bilateral high frequency sensorineural hearing loss   . Noise-induced hearing loss of both ears   . Nasal septal deviation   . Nasal turbinate hypertrophy   . Nasal obstruction   . Fatty liver   . Excessive drinking alcohol   . Elevated LFTs   . DDD (degenerative disc disease), lumbar       Preventive Care:  Health Maintenance   Topic Date Due   . HIV screen  02/21/1975   . Pneumococcal med risk (1 of 1 - PPSV23) 02/21/1979   . Diabetes screen  02/21/2000   . Shingles Vaccine (1 of 2 - 2 Dose Series) 02/20/2010   . Flu vaccine (1) 09/03/2015   . Lipid screen  01/27/2020   . Colon cancer screen colonoscopy  08/20/2021   . DTaP/Tdap/Td vaccine (2 - Td) 01/07/2022   . Hepatitis C screen  Completed      Self-testicular exams: No  Sexual activity: single partner, contraception - post menopausal status   Last eye exam: WITHIN LAST 2 YEARS , normal  Exercise: WORK, CONSTRUCTION WORK   Seatbelt use: YES  Lipid panel:    Lab Results   Component Value Date    CHOL 205 (H) 01/27/2015    TRIG 72 01/27/2015    HDL 86 (H) 01/27/2015    LDLCALC 105 (H) 01/27/2015     Lab Results   Component Value Date    NA 141 01/27/2015    K 4.9 01/27/2015    CL 102 01/27/2015    CO2 22 01/27/2015    BUN 11 01/27/2015    CREATININE 0.9 01/27/2015    GLUCOSE 104 01/27/2015    CALCIUM 9.2 01/27/2015          Living will: yes,   copy on file    Immunization History  Administered Date(s) Administered   . Tdap (Boostrix, Adacel) 01/08/2012       No Known Allergies  No outpatient prescriptions have been marked as taking for the 03/09/16 encounter (Office Visit) with Nani Ravens, MD.       Past Medical History:   Diagnosis Date   . Allergic rhinitis    . Asthma     ALLERGY INDUCED ASTHMA   . DDD (degenerative disc disease), lumbar 05/16/2010   . Elevated LFTs    . Environmental allergies 01/26/2012   . Hearing loss of both  ears 05/22/2012   . Hypercholesteremia 05/16/2010   . 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 Nasal turbinate hypertrophy 07/05/2012   . 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 Palpitations 05/16/2010   . Perennial sinusitis 01/26/2012   . Sinusitis, chronic 04/17/2012   . Tinnitus, subjective 05/22/2012     Past Surgical History:   Procedure Laterality Date   . APPENDECTOMY  AGE 76   . BACK SURGERY  1989    L4-L5     Family History   Problem Relation Age of Onset   . Heart Disease Father 71     CHF   . Diabetes Maternal Grandmother    . Other Maternal Grandfather      LUNG DISEASE   . Diabetes Paternal Grandmother      Social History     Social History   . Marital status: Married     Spouse name: N/A   . Number of children: N/A   . Years of education: N/A     Occupational History   . Not on file.     Social History Main Topics   . Smoking status: Former Smoker     Types: Cigars     Start date: 05/23/2003   . Smokeless tobacco: Never Used   . Alcohol use 0.0 oz/week      Comment: couple cans a night    . Drug use: No   . Sexual activity: Not on file     Other Topics Concern   . Not on file     Social History Narrative   . No narrative on file     No response to breo  Review of Systems:  A comprehensive review of systems was negative except for what was noted in the HPI.    Physical Exam:   Vitals:    03/09/16 0810   Height: 5\' 7"  (1.702 m)     There is no height or weight on file to calculate BMI.  Constitutional: He is oriented to person, place, and time. He appears well-developed and well-nourished. No distress.   HEENT:   Head: Normocephalic and atraumatic.   Right Ear: Tympanic membrane, external ear and ear canal normal.   Left Ear: Tympanic membrane, external ear and ear canal normal.   Nose: Nose normal.   Mouth/Throat: Oropharynx is clear and moist and mucous membranes are normal. No oropharyngeal exudate or posterior oropharyngeal erythema. He has no cervical adenopathy.   Eyes: Conjunctivae and extraocular  motions are normal. Pupils are equal, round, and reactive to light.   Neck: Full passive range of motion without pain. Neck supple. No JVD present. Carotid bruit is not present. No mass and no thyromegaly present.   Cardiovascular: Normal rate, regular rhythm, normal heart sounds and intact distal pulses.  Exam reveals no gallop and no friction rub. No murmur heard.  Pulmonary/Chest: Effort normal and breath sounds normal.  No respiratory distress. He has no wheezes, rhonchi or rales.   Abdominal: Soft, non-tender. Bowel sounds and aorta are normal. There is no organomegaly, mass or bruit.   Genitourinary:  Deferred by pt.  Musculoskeletal: Normal range of motion, no synovitis. He exhibits no edema.   Neurological: He is alert and oriented to person, place, and time. He has normal reflexes. No cranial nerve deficit. Coordination normal.   Skin: Skin is warm, dry and intact.  No suspicious lesions are noted.  Psychiatric: He has a normal mood and affect. His speech is normal and behavior is normal. Judgment, cognition and memory are normal.     Assessment/Plan:    There are no diagnoses linked to this encounter.    Review of Systems    Objective:   Physical Exam    Assessment:      1. Well adult exam     2. Mucopurulent chronic bronchitis (HCC)     3. Chronic allergic rhinitis, unspecified seasonality, unspecified trigger     4. Excessive drinking alcohol     5. Elevated LFTs  Comprehensive Metabolic Panel   6. Fatty liver     7. DDD (degenerative disc disease), lumbar     8. Hand arthritis  Rheumatoid Factor   9. Hypercholesteremia  Lipid Panel   10. Subjective tinnitus of both ears     11. Tremor     12. Nocturia  PSA, Prostatic Specific Antigen   13. Brachial plexopathy     14. Cancer screening     15. DDD (degenerative disc disease), cervical             Plan:      Colonoscopy normal - repeat 2023    utd on tdap  Diet/ exercise d/w pt  Fasting labs d/w pt include  Reviewed cxr/ pft from 2013.  Consider voltaren  gel topically o/w otc analgesics  PT referral for neck - consider emg/ pmr refer  Mri reviewed  Refer pulm - ct chest screening  anoro sample / trial inlieu of ics

## 2016-03-09 NOTE — Patient Instructions (Signed)
note

## 2016-03-09 NOTE — Addendum Note (Signed)
Addended byArmy Melia: Nikolis Berent on: 03/09/2016 09:08 AM     Modules accepted: Orders

## 2016-03-09 NOTE — Addendum Note (Signed)
Addended by: Kristie CowmanHOWELL, WENDY D on: 03/09/2016 09:10 AM     Modules accepted: Orders

## 2016-03-10 LAB — COMPREHENSIVE METABOLIC PANEL
ALT: 43 U/L — ABNORMAL HIGH (ref 10–40)
AST: 27 U/L (ref 15–37)
Albumin/Globulin Ratio: 1.8 (ref 1.1–2.2)
Albumin: 4.8 g/dL (ref 3.4–5.0)
Alkaline Phosphatase: 70 U/L (ref 40–129)
Anion Gap: 15 (ref 3–16)
BUN: 9 mg/dL (ref 7–20)
CO2: 25 mmol/L (ref 21–32)
Calcium: 9.4 mg/dL (ref 8.3–10.6)
Chloride: 100 mmol/L (ref 99–110)
Creatinine: 0.9 mg/dL (ref 0.9–1.3)
GFR African American: >60 (ref >60)
GFR Non-African American: >60 (ref >60)
Globulin: 2.7 g/dL
Glucose: 108 mg/dL — ABNORMAL HIGH (ref 70–99)
Potassium: 5 mmol/L (ref 3.5–5.1)
Sodium: 140 mmol/L (ref 136–145)
Total Bilirubin: 0.4 mg/dL (ref 0.0–1.0)
Total Protein: 7.5 g/dL (ref 6.4–8.2)

## 2016-03-10 LAB — LIPID PANEL
Cholesterol, Total: 240 mg/dL — ABNORMAL HIGH (ref 0–199)
HDL: 79 mg/dL — ABNORMAL HIGH (ref 40–60)
LDL Calculated: 141 mg/dL — ABNORMAL HIGH (ref <100)
Triglycerides: 100 mg/dL (ref 0–150)
VLDL Cholesterol Calculated: 20 mg/dL

## 2016-03-10 LAB — RHEUMATOID FACTOR: Rheumatoid Factor: <10 IU/mL (ref <14)

## 2016-03-10 LAB — PSA PROSTATIC SPECIFIC ANTIGEN: PSA: 0.96 ng/mL (ref 0.00–4.00)

## 2016-03-15 NOTE — Other (Unsigned)
Harold Gordon  March 01, 1960  Chief Complaint  Patient presents with  ?????? Cardiovascular Follow-up        Harold Gordon returns for scheduled 1 year followup.  He has a history of CAD  based on coronary calcium score of 18.  He has hyperlipidemia and is on  pravastatin 10 mg per day.  His weight is up about 7 pounds since I last saw  him.  Unfortunately, his lipid profile has worsened and his LDL cholesterol is  up to 161.  I have asked him to increase his pravastatin to 20 mg per day and   I  will have him repeat his lipid profile in 1 month and will make further  recommendations at that time.  He remains on aspirin 81 mg per day.  He is   also  on fish oil.  His blood pressure and heart rate are fine in the office today.  His exam is otherwise unremarkable.  He has had no chest pain or shortness of  breath.  No exercise-induced dyspnea or discomfort.    Patient's Medications  Current Medications   ALBUTEROL (ALBUTEROL) 90 MCG/ACTUATION IN HFAA    Take 2 Puffs by inhalation.      Order Dose: 2 Puffs   ASPIRIN 81 MG TABLET, DELAYED RELEASE (E.C.)    Take 81 mg by mouth daily.      Order Dose: 81 mg   CETIRIZINE (ZYRTEC) 10 MG TABLET    Take 10 mg by mouth daily.      Order Dose: 10 mg   OMEGA-3S/DHA/EPA/FISH OIL (OMEGA 3 PO)    Take  by mouth daily.      Order Dose: --   PRAVASTATIN (PRAVACHOL) 10 MG TABLET    TAKE 1 TABLET BY MOUTH AT BEDTIME      Order Dose: --      Discontinued Medications   FEXOFENADINE (ALLEGRA ALLERGY) 180 MG TABLET    Take 180 mg by mouth daily.      Notes: --      Review of Systems  Constitutional: Negative.  HENT: Negative.  Eyes: Negative.  Respiratory: Negative.  Cardiovascular: Negative.  Gastrointestinal: Negative.  Genitourinary: Negative.  Musculoskeletal: Negative.  Skin: Negative.  Neurological: Negative.  Endo/Heme/Allergies: Negative.  Psychiatric/Behavioral: Negative.  All other systems reviewed and are negative.          Vitals:   03/15/16 1525 03/15/16 1527  BP:  138/82  Pulse:  94  Weight: 181 lb (82.1 kg)  Height:  (1.727 m)    Body mass index is 27.52 kg/m??????.    Physical Exam  Constitutional: He is well-developed, well-nourished, and in no distress.   Vital  signs are normal. No distress.  Neck: Normal carotid pulses, no hepatojugular reflux and no JVD present.   Carotid  bruit is not present.  Cardiovascular: Normal rate, regular rhythm, S1 normal, S2 normal, normal   heart  sounds, intact distal pulses and normal pulses.   No extrasystoles are   present.  PMI is not displaced.  Exam reveals no gallop, no S3, no S4 and no friction   rub.  No murmur heard.   No systolic murmur is present   No diastolic murmur is present  Pulmonary/Chest: Effort normal and breath sounds normal. No respiratory  distress. He has no wheezes.  Abdominal: Normal appearance, normal aorta and bowel sounds are normal. There   is  no tenderness. There is no CVA tenderness.  Skin: He is not  diaphoretic.  Psychiatric: Mood, memory, affect and judgment normal.  Vitals reviewed.      IMPRESSION:      ICD-9-CM ICD-10-CM  1. Coronary artery disease involving native coronary artery of native heart  without angina pectoris 414.01 I25.10  2. Dyslipidemia 272.4 E78.5  3. Palpitations 785.1 R00.2  4. SOB (shortness of breath) 786.05 R06.02          PLAN AND RECOMMENDATION:    I will plan to see him back in 1 month.  I will be in touch with him regarding  the results of his lipid profile and make further adjustments as needed.      (RETURN OFFICE VISIT)

## 2016-03-23 ENCOUNTER — Encounter: Admit: 2016-03-23 | Primary: Family Medicine

## 2016-03-23 NOTE — Other (Signed)
Physical Therapy Daily Treatment Note  Date:  03/23/2016    Patient Name:  Harold Gordon    DOB:  1960/07/25  MRN: [id number]  Restrictions/Precautions:    Pertinent Medical History: DDD-Lumbar, Asthma, Tinnitus, Sinusitis, Back Surgery L4-5 1989  Medical/Treatment Diagnosis Information:  Diagnosis: Brachial Plexopathy, DDD-cervical  Treatment Diagnosis: Decreased Cervical AROM and radiating pain affecting ADLs  Insurance/Certification information:  PT Insurance Information: [facility] [employer]  Physician Information:  Referring Practitioner: Dr. Kennith Center  Plan of care signed (Y/N):    Visit# / total visits:   1/12  Pain level: 2-5/10     G-Code (if applicable):      Date / Visit # G-Code Applied:  /  PT G-Codes  Functional Assessment Tool Used: PT Assessment  Functional Limitation: Changing and maintaining body position  Changing and Maintaining Body Position Current Status [phone]): At least 20 percent but less than 40 percent impaired, limited or restricted  Changing and Maintaining Body Position Goal Status (D2202): At least 1 percent but less than 20 percent impaired, limited or restricted    Progress Note: []   Yes  []   No  Next due by: Visit #10      History of Injury:  Pt states last july, 2017 he started having neck pain for no apparent reason. Pt states neck pain comes and goes. He gets the pain in the left side of his shoulder, down his left arm, and tingling into his left hand. Pain is rated 2-5/10. Pain is deep, dull, and pins/needles. Hasn't had any noticeable weakness in his left arm. Pt is not limited in any activities, works through the pain.     Subjective:       Objective:  Observation:   Test measurements:      Exercises:  Exercise/Equipment Resistance/Repetitions Other comments                                                 HEP     Left UT stretch 2 x 30" 3/22   Cervical AROM Right sidebend x 10  Cervical Rotation x 10 B 3/22               Other Therapeutic Activities:    Discussed purpose,  risks and benefits of PT with pt who is agreeable to POC.     Home Exercise Program:    Instructed patient on an HEP. Patient demonstrated exercises correctly. Handout with pictures and # of reps/sets was given. Exercises are listed above.    Manual Treatments:   STM to cervical paraspinals, UT stretch, Cervical Sideglides, Light traction x 17 min    Modalities:    MHP x 15 min to c-spine in supine    Timed Code Treatment Minutes:  27    Total Treatment Minutes:  52    Treatment/Activity Tolerance:  [x]  Patient tolerated treatment well []  Patient limited by fatigue  []  Patient limited by pain  []  Patient limited by other medical complications  []  Other:     Prognosis: [x]  Good [x]  Fair  []  Poor    Patient Requires Follow-up: [x]  Yes  []  No    Plan:   []  Continue per plan of care []  Alter current plan (see comments)  [x]  Plan of care initiated []  Hold pending MD visit []  Discharge    Plan for Next Session:  Manual: Cervical STM, sideglides, UT stretch, Cervical traction  Add mechanical cervical traction if tolerating ok  Modalities: Add [place] if not improving. MHP  Cervical and scapular strengthening    Electronically signed by:  Rande Brunt, PT

## 2016-03-23 NOTE — Progress Notes (Signed)
Physical Therapy  Initial Assessment  Date: 03/23/2016  Patient Name: Harold Gordon  MRN: [id number]  DOB: 03-Oct-1960     Treatment Diagnosis: Decreased Cervical AROM and radiating pain affecting ADLs    Restrictions  Restrictions/Precautions  Restrictions/Precautions: Fall Risk (low)    Subjective   General  Chart Reviewed: Yes  Additional Pertinent Hx: PLOF- Independent  Referring Practitioner: Dr. Kennith Center  Referral Date : 03/09/16  Diagnosis: Brachial Plexopathy, DDD-cervical  PT Visit Information  Onset Date: 07/16/16  PT Insurance Information: [facility] [employer]  Subjective  Subjective: Pt states last july, 2017 he started having neck pain for no apparent reason. Pt states neck pain comes and goes. He gets the pain in the left side of his shoulder, down his left arm, and tingling into his left hand. Pain is rated 2-5/10. Pain is deep, dull, and pins/needles. Hasn't had any noticeable weakness in his left arm. Pt is not limited in any activities, works through the pain.        Vision/Hearing  Vision  Vision: Within Functional Limits  Hearing  Hearing: Within functional limits    Orientation  Orientation  Overall Orientation Status: Within Normal Limits    Social/Functional History     Objective     Observation/Palpation  Posture: Fair  Palpation: no tenderness in c-spine or left shoulder    AROM RUE (degrees)  RUE AROM : WNL  AROM LUE (degrees)  LUE AROM : WNL  Spine  Cervical: AROM: Rotation R 65 L 55. Flexion 35. Extension 45  Joint Mobility  Spine: Cervical Sideglides: Mild limitation in upper cervical spine.    Strength RUE  Strength RUE: WNL  Strength LUE  Strength LUE: WNL     Additional Measures  Flexibility: Mild UT tightness  Sensation  Overall Sensation Status: WNL                                     Assessment   Conditions Requiring Skilled Therapeutic Intervention  Body structures, Functions, Activity limitations: Decreased ROM;Decreased ADL status;Decreased functional mobility   Assessment: PLOF-  independent. Pt has radiating pain into L shoulder and UE from the cervical spine as well as decreased cervical AROM  Treatment Diagnosis: Decreased Cervical AROM and radiating pain affecting ADLs  Prognosis: Good  REQUIRES PT FOLLOW UP: Yes         Plan   Plan  Times per week: 2  Times per day: Daily  Plan weeks: 6  Current Treatment Recommendations: ROM, Manual Therapy - Soft Tissue Mobilization, Pain Management, Home Exercise Program, Manual Therapy - Joint Manipulation, Modalities    G-Code  PT G-Codes  Functional Assessment Tool Used: PT Assessment  Functional Limitation: Changing and maintaining body position  Changing and Maintaining Body Position Current Status (U9811): At least 20 percent but less than 40 percent impaired, limited or restricted  Changing and Maintaining Body Position Goal Status (B1478): At least 1 percent but less than 20 percent impaired, limited or restricted    OutComes Score                                           Goals  Short term goals  Time Frame for Short term goals: 2 weeks  Short term goal 1: Pt will show independence in HEP  Short term goal 2: Pt will report no radiating symptoms in L UE  Long term goals  Time Frame for Long term goals : 4 weeks  Long term goal 1: Pt will report <3/10 pain consistently with no radiating pain so he can return to work ADLs  Long term goal 2: Pt will show full cervical AROM without pain  Patient Goals   Patient goals : "relieve pain"       Therapy Time   Individual Concurrent Group Co-treatment   Time In           Time Out           Minutes                   Harold Gordon, PT

## 2016-03-23 NOTE — Plan of Care (Signed)
Outpatient Physical Therapy  []  [facility]    Phone: [phone]   Fax: [phone]   [x]  [facility]  Phone: [phone]              Fax: [phone]  []  [facility]   Phone: [phone]   Fax: [phone]     To: Referring Practitioner: Dr. Kennith CenterHines      Patient: Harold Gordon   DOB: Jan 15, 1960   MRN: [id number]  Evaluation Date: 03/23/2016      Diagnosis Information:  ?? Diagnosis: Brachial Plexopathy, DDD-cervical   ?? Treatment Diagnosis: Decreased Cervical AROM and radiating pain affecting ADLs     Physical Therapy Certification Form  Dear Dr. Kennith CenterHines,  The following patient has been evaluated for physical therapy services and for therapy to continue, Medicare requires monthly physician review of the treatment plan. Please review the attached evaluation and/or summary of the patient's plan of care, and verify that you agree therapy should continue by signing the attached document and sending it back to our office.    Plan of Care/Treatment to date:  [x]  Therapeutic Exercise    [x]  Modalities:  [x]  Therapeutic Activity     [x]  Ultrasound  [x]  Electrical Stimulation  []  Gait Training      [x]  Cervical Traction []  Lumbar Traction  []  Neuromuscular Re-education    [x]  Cold/hotpack []  Iontophoresis   [x]  Instruction in HEP     Other:  [x]  Manual Therapy      []              []  Aquatic Therapy      []            ?      Frequency/Duration:  # Days per week: []  1 day # Weeks: []  1 week []  5 weeks     [x]  2 days?   []  2 weeks []  6 weeks     []  3 days   []  3 weeks []  7 weeks     []  4 days   [x]  4 weeks []  8 weeks    Rehab Potential: []  Excellent [x]  Good []  Fair  []  Poor       Electronically signed by:  Rande BruntKevin Val Farnam, PT      If you have any questions or concerns, please don't hesitate to call.  Thank you for your referral.      Physician Signature:________________________________Date:__________________  By signing above, therapist???s plan is approved by physician

## 2016-03-27 ENCOUNTER — Encounter: Primary: Family Medicine

## 2016-03-30 ENCOUNTER — Encounter: Primary: Family Medicine

## 2016-04-04 ENCOUNTER — Inpatient Hospital Stay: Primary: Family Medicine

## 2016-04-04 NOTE — Other (Signed)
Physical Therapy Daily Treatment Note  Date:  04/04/2016    Patient Name:  Harold Gordon    DOB:  09-Jul-1960  MRN: [id number]  Restrictions/Precautions:    Pertinent Medical History: DDD-Lumbar, Asthma, Tinnitus, Sinusitis, Back Surgery L4-5 1989  Medical/Treatment Diagnosis Information:  Diagnosis: Brachial Plexopathy, DDD-cervical  Treatment Diagnosis: Decreased Cervical AROM and radiating pain affecting ADLs  Insurance/Certification information:  PT Insurance Information: [facility] [employer]  Physician Information:  Referring Practitioner: Dr. Kennith Center  Plan of care signed (Y/N):    Visit# / total visits:   2/12  Pain level: 2-5/10     G-Code (if applicable):      Date / Visit # G-Code Applied:  /  PT G-Codes  Functional Assessment Tool Used: PT Assessment  Functional Limitation: Changing and maintaining body position  Changing and Maintaining Body Position Current Status [phone]): At least 20 percent but less than 40 percent impaired, limited or restricted  Changing and Maintaining Body Position Goal Status (F6213): At least 1 percent but less than 20 percent impaired, limited or restricted    Progress Note:   Yes    No  Next due by: Visit #10      History of Injury:  Pt states last july, 2017 he started having neck pain for no apparent reason. Pt states neck pain comes and goes. He gets the pain in the left side of his shoulder, down his left arm, and tingling into his left hand. Pain is rated 2-5/10. Pain is deep, dull, and pins/needles. Hasn't had any noticeable weakness in his left arm. Pt is not limited in any activities, works through the pain.     Subjective:     04/04/16: States pain and symptoms in arm are about the same. The pain and tingling tend to come and go. Only feeling it a little bit right now. Pt states he has done his HEP some.    Objective:  Observation:   Test measurements:      Exercises:  Exercise/Equipment Resistance/Repetitions Other comments                                                  HEP     Left UT stretch 2 x 30" 3/22   Cervical AROM Right sidebend x 10  Cervical Rotation x 10 B 3/22               Other Therapeutic Activities:      Home Exercise Program:      Manual Treatments:   STM to cervical paraspinals, UT stretch, Cervical Sideglides, Light traction x 26 min    Modalities:    Mechanical Cervical Traction: 17#/10#, x 10 min  MHP x 15 min to c-spine in supine    Timed Code Treatment Minutes:  30    Total Treatment Minutes:  55    Treatment/Activity Tolerance:   Patient tolerated treatment well  Patient limited by fatigue   Patient limited by pain   Patient limited by other medical complications   Other:     Prognosis:  Good  Fair   Poor    Patient Requires Follow-up:  Yes   No    Plan:    Continue per plan of care  Alter current plan (see comments)   Plan of care initiated  Hold pending MD visit  Discharge  Plan for Next Session:    Manual: Cervical STM, sideglides, UT stretch, Cervical traction  Add mechanical cervical traction if tolerating ok  Modalities: Add [place] if not improving. MHP  Cervical and scapular strengthening    Electronically signed by:  Rande Brunt, PT

## 2016-04-06 ENCOUNTER — Inpatient Hospital Stay: Admit: 2016-04-06 | Primary: Family Medicine

## 2016-04-06 NOTE — Other (Signed)
Physical Therapy Daily Treatment Note  Date:  04/06/2016    Patient Name:  Harold Gordon    DOB:  June 05, 1960  MRN: [id number]  Restrictions/Precautions:    Pertinent Medical History: DDD-Lumbar, Asthma, Tinnitus, Sinusitis, Back Surgery L4-5 1989  Medical/Treatment Diagnosis Information:  Diagnosis: Brachial Plexopathy, DDD-cervical  Treatment Diagnosis: Decreased Cervical AROM and radiating pain affecting ADLs  Insurance/Certification information:  PT Insurance Information: [facility] [employer]  Physician Information:  Referring Practitioner: Dr. Kennith Center  Plan of care signed (Y/N):    Visit# / total visits:   3/12  Pain level: 2-5/10     G-Code (if applicable):      Date / Visit # G-Code Applied:  /  PT G-Codes  Functional Assessment Tool Used: PT Assessment  Functional Limitation: Changing and maintaining body position  Changing and Maintaining Body Position Current Status [phone]): At least 20 percent but less than 40 percent impaired, limited or restricted  Changing and Maintaining Body Position Goal Status (B1478): At least 1 percent but less than 20 percent impaired, limited or restricted    Progress Note:   Yes    No  Next due by: Visit #10      History of Injury:  Pt states last july, 2017 he started having neck pain for no apparent reason. Pt states neck pain comes and goes. He gets the pain in the left side of his shoulder, down his left arm, and tingling into his left hand. Pain is rated 2-5/10. Pain is deep, dull, and pins/needles. Hasn't had any noticeable weakness in his left arm. Pt is not limited in any activities, works through the pain.     Subjective:     04/04/16: States pain and symptoms in arm are about the same. The pain and tingling tend to come and go. Only feeling it a little bit right now. Pt states he has done his HEP some.  04/06/16: Pt states his pain is about the same overall. Felt good on Tuesday for the day after traction    Objective:  Observation:   Test measurements:       Exercises:  Exercise/Equipment Resistance/Repetitions Other comments                                                 HEP     Left UT stretch 2 x 30" 3/22   Cervical AROM Right sidebend x 10  Cervical Rotation x 10 B 3/22               Other Therapeutic Activities:      Home Exercise Program:      Manual Treatments:   STM to cervical paraspinals, UT stretch, Cervical Sideglides, Light traction x 26 min    Modalities:    Mechanical Cervical Traction: 19#/10#, x 10 min  MHP x 15 min to c-spine in supine    Timed Code Treatment Minutes:  29    Total Treatment Minutes:  45    Treatment/Activity Tolerance:   Patient tolerated treatment well  Patient limited by fatigue   Patient limited by pain   Patient limited by other medical complications   Other:     Prognosis:  Good  Fair   Poor    Patient Requires Follow-up:  Yes   No    Plan:    Continue per plan of care   Alter current plan (see comments)   Plan of care initiated  Hold pending MD visit  Discharge    Plan for Next Session:    Manual: Cervical STM, sideglides, UT stretch, Cervical traction  Add mechanical cervical traction if tolerating ok  Modalities: Add [place] if not improving. MHP  Cervical and scapular strengthening    Electronically signed by:  Rande Brunt, PT

## 2016-04-10 MED ORDER — VOLTAREN 1 % TD GEL
1 % | TRANSDERMAL | 2 refills | Status: AC
Start: 2016-04-10 — End: ?

## 2016-04-10 NOTE — Telephone Encounter (Signed)
SEEN 03/09/16 FOR PHYSICAL NO APT SCHEDULED.

## 2016-04-11 ENCOUNTER — Inpatient Hospital Stay: Admit: 2016-04-11 | Primary: Family Medicine

## 2016-04-11 NOTE — Other (Signed)
Physical Therapy Daily Treatment Note  Date:  04/11/2016    Patient Name:  Harold Gordon    DOB:  01-21-1960  MRN: [id number]  Restrictions/Precautions:    Pertinent Medical History: DDD-Lumbar, Asthma, Tinnitus, Sinusitis, Back Surgery L4-5 1989  Medical/Treatment Diagnosis Information:  Diagnosis: Brachial Plexopathy, DDD-cervical  Treatment Diagnosis: Decreased Cervical AROM and radiating pain affecting ADLs  Insurance/Certification information:  PT Insurance Information: [facility] [employer]  Physician Information:  Referring Practitioner: Dr. Kennith Center  Plan of care signed (Y/N):    Visit# / total visits:   4/12  Pain level: 2-5/10     G-Code (if applicable):      Date / Visit # G-Code Applied:  /  PT G-Codes  Functional Assessment Tool Used: PT Assessment  Functional Limitation: Changing and maintaining body position  Changing and Maintaining Body Position Current Status [phone]): At least 20 percent but less than 40 percent impaired, limited or restricted  Changing and Maintaining Body Position Goal Status (M5784): At least 1 percent but less than 20 percent impaired, limited or restricted    Progress Note:   Yes    No  Next due by: Visit #10      History of Injury:  Pt states last july, 2017 he started having neck pain for no apparent reason. Pt states neck pain comes and goes. He gets the pain in the left side of his shoulder, down his left arm, and tingling into his left hand. Pain is rated 2-5/10. Pain is deep, dull, and pins/needles. Hasn't had any noticeable weakness in his left arm. Pt is not limited in any activities, works through the pain.     Subjective:     04/04/16: States pain and symptoms in arm are about the same. The pain and tingling tend to come and go. Only feeling it a little bit right now. Pt states he has done his HEP some.  04/06/16: Pt states his pain is about the same overall. Felt good on Tuesday for the day after traction  04/11/16: Pt states the pain in his shoulder has improved.  He still gets tingling in his left hand intermittently     Objective:  Observation:   Test measurements:    4/10: Cervical AROM: Rotation R 55 and L 50. Flex 60 and Ext 35. Sidebend R 35 and L 25    Exercises:  Exercise/Equipment Resistance/Repetitions Other comments                                                 HEP     Left UT stretch 2 x 30" 3/22   Cervical AROM Right sidebend x 10  Cervical Rotation x 10 B 3/22               Other Therapeutic Activities:      Home Exercise Program:      Manual Treatments:   STM to cervical paraspinals, UT stretch, Cervical Sideglides, Extension/Rotation mobilization, Light traction x 25  Thoracic mobilization in sitting    Modalities:    Mechanical Cervical Traction: 20#/10#, x 11 min      Timed Code Treatment Minutes:  30    Total Treatment Minutes:  49    Treatment/Activity Tolerance:   Patient tolerated treatment well  Patient limited by fatigue   Patient limited by pain   Patient limited by  other medical complications   Other:     Prognosis:  Good  Fair   Poor    Patient Requires Follow-up:  Yes   No    Plan:    Continue per plan of care  Alter current plan (see comments)   Plan of care initiated  Hold pending MD visit  Discharge    Plan for Next Session:    Manual: Cervical STM, sideglides, UT stretch, Cervical traction  Add mechanical cervical traction if tolerating ok  Modalities: Add [place] if not improving. MHP  Cervical and scapular strengthening    Electronically signed by:  Rande Brunt, PT

## 2016-04-12 NOTE — Progress Notes (Signed)
LETTER SENT TO PT, PT DUE FOR CT OF CHEST.  [facility]

## 2016-04-13 ENCOUNTER — Inpatient Hospital Stay: Primary: Family Medicine

## 2016-04-13 NOTE — Progress Notes (Signed)
Physical Therapy  Cancellation/No-show Note  Patient Name:  Harold Gordon  DOB:  1960-05-31   Date:  04/13/2016  Cancelled visits to date: 0  No-shows to date: 1    For today's appointment patient:    Cancelled    Rescheduled appointment    No-show     Reason given by patient:    Patient ill    Conflicting appointment    No transportation      Conflict with work    No reason given    Other:     Comments:   PT called and pt had something come up    Electronically signed by:  Rande Brunt, PT

## 2016-04-16 NOTE — Other (Unsigned)
[id number]  [address] [place] [address]  Phone:  [phone]          Discharge Summary  04/16/2016   Harold Gordon   MRN: [id number]         Admission Information     Date & Time 04/16/2016 Department [id number]      Allergies as of 04/17/2016    No Known Allergies     Immunizations Administered for This Admission  Reviewed on 04/16/2016    Name Date Dose VIS Date Route     Pneumococcal polysaccharide (PPSV23) 04/17/2016 0.5   mL 04/25/2013 Intramuscular        Reason for Admission      Admit From ED/Outpatient/SDS/Triage (ADT109)  Once-Routine ADT    Diagnosis:   Asthmatic bronchitis, mild intermittent, with acute exacerbation          Problem List  Date Reviewed: 04/19/2015          Class Noted    * (Principal)Moderate persistent asthma with exacerbation    04/16/2016    AKI (acute kidney injury) (HCC)    04/16/2016    Heavy alcohol use    04/16/2016    Sebaceous gland hyperplasia Face    11/13/2011        Pending Results     Start        04/16/16 0949  Respiratory Pathogen Panel PCR  Once-Routine            Destination     No service has been selected for the patient.      Durable Medical Equipment     No service has been selected for the patient.      Dialysis/Infusion     No service has been selected for the patient.      Home Medical Care     No service has been selected for the patient.      Social Care     No service has been selected for the patient.      Things you need to do     Thursday Apr 20, 2016    Office Visit with Marquette Saa, MD at  3:10 PM    Just as a reminder, this Dermatology skin exam is not considered   "Preventative Care" by your insurance.  It is considered a specialty visit.    If you have questions, please call your Insurance company.     Where:  [facility] D[facility]at[facility])    [facility] Dermatology [facility] [address] [place]   [address] [phone]         Medication List      START taking these medications      Last dose Next dose Comments As  Needed   budesonide-formoterol 160-4.5 MCG/ACT Aero Commonly known as:  SYMBICORT Use 2   puffs 2 (two) times daily.        ipratropium 0.03 % Soln Commonly known as:  ATROVENT Use 2 sprays in each   nostril 2 (two) times daily.             CHANGE how you take these medications      Last dose Next dose Comments As Needed   albuterol 108 (90 Base) MCG/ACT Aers Commonly known as:  PROAIR HFA, PROVENTIL   HFA, VENTOLIN HFA Use 2 puffs every 6 (six) hours as needed. What changed:    Another medication with the same name was removed. Continue taking this  medication, and follow the directions you see here.        fish oil 1000 MG Caps Commonly known as:  MAXEPA Take 1 capsule by mouth   daily. Start taking on:  04/18/2016 What changed: ???? medication strength ???? how   much to take        predniSONE 20 MG Tabs Commonly known as:  DELTASONE Take 2 tablets by mouth   daily for 5 days. What changed: ???? how much to take ???? how to take this   ???? when to take this ???? additional instructions             CONTINUE taking these medications      Last dose Next dose Comments As Needed   aspirin 81 MG Tbec Take 81 mg by mouth daily.        benzonatate 100 MG Caps Commonly known as:  TESSALON Take 1 capsule by mouth 3   (three) times daily as needed.        cetirizine 10 MG Tabs Commonly known as:  ZYRTEC Take 10 mg by mouth daily.          pravastatin 10 MG Tabs Commonly known as:  PRAVACHOL Take 20 mg by mouth   daily.        VOLTAREN 1 % Gel Generic drug:  diclofenac Apply 1 applicator topically 2   (two) times daily as needed.             STOP taking these medications    ANORO ELLIPTA 62.5-25 MCG/INH Aepb Generic drug:  umeclidinium-vilanterol    fexofenadine 180 MG Tabs Commonly known as:  ALLEGRA    FLOVENT HFA 110 MCG/ACT Aero Generic drug:  fluticasone          Where to Get Your Medications      You can pick up these prescriptions at any pharmacy. Take as directed.    Ask your nurse or doctor about these  medications   budesonide-formoterol 160-4.5 MCG/ACT Aero   ipratropium 0.03 % Soln   predniSONE 20 MG Tabs             Patient Instructions             Asthma Discharge Instructions, Adult    About this topic   Asthma is a lung disease in which the airways are swollen and narrowed. This   causes a person to have trouble breathing. Be aware of what causes your asthma   attacks so you can avoid it. These things can make your asthma worse:  ???? Viral illnesses  ???? Exercise  ???? Having an allergic reaction to food, drugs, or other substances   ???? Contact with things that can bother the lungs. These are things like smoke,   dust, pollens, molds, animal hairs, or chemicals.  ???? Changes in the weather  ???? Untreated gastric reflux           What care is needed at home?   ???? Ask your doctor what you need to do when you go home. Make sure you ask   questions if you do not understand what the doctor says. This way you will   know what you need to do.  ???? Your doctor may give you an Asthma Action Plan. This will help you know how   to treat your asthma and what to do if your signs get worse. Make sure you   understand the action plan.  ???? Take your drugs as  ordered by your doctor.  ???? Be aware of what causes your asthma and watch out for the signs.  ???? Always have a rescue inhaler with you.  ???? Rest when you are feeling tired.  ???? The doctor may teach you how to use a peak flow meter. This is a small   device that shows how well the air moves out of your lungs. Ask how often you   should use your peak flow meter. Keep trach of your results. These will help   you know how well you are breathing.  What follow-up care is needed?   Your doctor may ask you to make visits to the office to check on your   progress. Be sure to keep these visits.  What drugs may be needed?   ???? The doctor may order drugs to:  ???? Relax the muscles around your airways. This is a quick-acting drug that   will help you breathe easier. These are your rescue  drugs.  ???? Prevent an asthma attack. These drugs reduce swelling of the airways in   your lungs. These are your controller drugs.  ???? Follow your Asthma Action Plan to know what drugs to take.  Will physical activity be limited?   If your asthma is well controlled, you should not need to limit your activity.   Talk to your doctor if you are having trouble during your everyday   activities.  What problems could happen?   ???? Constant coughing  ???? Trouble breathing  ???? Decreased lung function  What can be done to prevent this health problem?   Asthma cannot be prevented. You can work to prevent asthma attacks. Here are   some things that may help:  ???? Learn about what triggers your asthma. Stay away from those things. Common   triggers are dust and pollens; scents from candles, detergents, or perfumes;   and pet hair.  ???? Do not smoke. Do not allow others to smoke near you or in the car with you.   Smoke can linger on clothes and furniture and cause breathing problems.  ???? Treat cough and colds. These can start an asthma attack.   ???? Make sure you get a flu shot each year.  When do I need to call the doctor?   ???? If you need to use your rescue inhaler 2 to 3 times in one week.  ???? If it has been 15 minutes since your last treatment and you are not   breathing any better.  ???? You are not feeling better in 2 to 3 days or you are feeling worse  Teach Back: Helping You Understand   The Teach Back Method helps you understand the information we are giving you.   The idea is simple. After talking with the staff, tell them in your own words   what you were just told. This helps to make sure the staff has covered each   thing clearly. It also helps to explain things that may have been a bit   confusing. Before going home, make sure you are able to do these:  ???? I can tell you about my condition.  ???? I can tell you the difference between a rescue drug and a controller drug.  ???? I can tell you what I will do if it has been 15  minutes since my last   treatment and I am not breathing any better.  ???? I can tell you what I will do  if I am using my rescue inhaler 2 to 3 times   in one week.  Where can I learn more?   [place] Lung Association  <http://www.lung.ca/diseases-maladies/asthma-asthme_e.php>   National Heart Lung and Blood Institute  <http://www.nhlbi.nih.gov/health/health-topics/topics/asthma/>   Last Reviewed Date   2015-10-07  Consumer Information Use and Disclaimer   This information is not specific medical advice and does not replace   information you receive from your health care provider. This is only a brief   summary of general information. It does NOT include all information about   conditions, illnesses, injuries, tests, procedures, treatments, therapies,   discharge instructions or life-style choices that may apply to you. You must   talk with your health care provider for complete information about your health   and treatment options. This information should not be used to decide whether   or not to accept your health care provider????????s advice, instructions or   recommendations. Only your health care provider has the knowledge and training   to provide advice that is right for you.  Copyright   Copyright ???? 2017 [facility] [employer] and its   affiliates and/or licensors. All rights reserved.           Patient Belongings      Most Recent Value   Patient Belongings at Bedside    Belongings at Bedside  Cell phone, Jewelry, Clothing   Vision - Corrective Lenses  Glasses   Jewelry  Merchandiser, retail, Shirt, Sports administrator, Underpants   Patient Belongings Sent Home/Family    Patient Belongings Sent to Security          IF YOU ARE A SMOKER OR HAVE SMOKED IN THE LAST 12 MONTHS, WE ENCOURAGE YOU TO   EXPLORE OPTIONS FOR QUITTING. Refer to the literature given to you while in   the hospital for advice on how to quit.  For Pneumonia Patients: I understand   that the pneumonia vaccine is recommended for  people 41 and older and people   with chronic health conditions, once in a lifetime. It should be repeated   every 5-10 years if received before age 57. The flu vaccine should be given   every year for people 42 and older, younger for those with chronic health   conditions.  For Heart Failure/Cardiac Patients: I understand:  - Regular   activity within my limitations is important for my health and can consult my   doctor for suggestions on exercise.  - Eating a low fat, low cholesterol, &   low sodium diet with plenty of fruits and vegetables can reduce my chance of   suffering a future heart attack.  - Weighing myself daily and reporting a gain   of 2-3 pounds a day and/or 5-6 pounds a week to my physician is important.  -   If any of my symptoms worsen, such as edema/swelling or difficulty breathing,   contact my doctor or go to the nearest emergency department. - Be sure to   keep all appointments with my doctor.  For Stroke Patients: I understand that   by carefully controlling and monitoring any of the risk factors listed, I can   decrease my risk of future stroke:  - High Blood Pressure (hypertension)  -   High Blood Cholesterol (hyperlipidemia) - Diabetes  - Smoking  - Alcohol Abuse    - Drug Abuse If you are experiencing signs and symptoms of a stroke or   transient  ischemic attack (TIA), which can include: ???? Slurred speech,   confusion or trouble understanding ???? Severe headache ???? Sudden difficulty   with vision ???? Sudden numbness or weakness of an extremity  ???? Loss of balance   or coordination CALL 911 IMMEDIATELY  If You Are Prescribed Opioids For Pain:   ???? Never take opioids in greater amounts or more often than prescribed.   ???? Follow up with your primary health care provider within 2 weeks. ???? Work   together to create a plan on how to manage your pain. ???? Talk about ways to   help manage your pain that don't involve prescription opioids. ???? Talk about   any and all concerns and side effects.  ???? Help prevent misuse and abuse.   ???? Never sell or share prescription opioids. ???? Never use another person's   prescription opioids. ???? Store prescription opioids in a secure place and out   of reach of others. ???? Safely dispose of unused prescriptions: Find your   community drug take-back program or your pharmacy mail-back program, or flush   them down the toilet, following guidance from the Food and Drug Administration   (woondaal.com). ???? Visit PearCard.com.au to   learn more about the risks of opioid abuse and overdose. ???? If you believe you   may be struggling with addiction, tell your health care provider and ask for   guidance or call SAMHSA's National Helpline at 1-800-662-HELP.           I have received and reviewed my discharge instructions, had them explained to   me and understand them. I have also been given an opportunity to ask questions   about them and have had all of my questions answered to my satisfaction.     Patient Signature:      ______________________________    Date:     _______________                 Responsible Party (Printed):     ______________________________    Responsible Party Signature:     ______________________________            MyChart Activation Instructions    April 17, 2016     [name]  [address]  [place] [address]    Dear Molly Maduro:    Thank you for enrolling in MyChart. Please follow the instructions below to   securely access your online medical record. MyChart allows you to send   messages to your doctor, view your test results, renew your prescriptions,   request appointments, and more.    How Do I Sign Up?  In your Internet browser, go to [email or web address].  Click on the Sign Up Now link in the Sign In box. You will see the New Member   Sign Up page.  Enter your MyChart Access Code exactly as it appears below. You will not need   to use this code after you????????ve completed the sign-up process. If you do not   sign up  before the expiration date, you must request a new code.                  MyChart Access Code: Activation code not generated  Current MyChart Status: Patient Declined    Enter your last 4 digits of your Social Security Number and Date of Birth   (mm/dd/yyyy) as indicated and click Submit. You will be taken to the next   sign-up page.  Create a  MyChart ID. This will be your MyChart login ID and cannot be changed,   so think of one that is secure and easy to remember.  Create a Scientist, research (physical sciences). You can change your password at any time.  Enter your Password Reset Question and Answer. This can be used at a later   time if you forget your password.   Enter your e-mail address. You will receive e-mail notification when new   information is available in [facility].  Click Sign Up. You can now view your medical record.     Additional Information  If you have questions, you can email [email or web address].  Remember, MyChart   is NOT to be used for urgent needs. For medical emergencies, dial 911.

## 2016-04-25 NOTE — Progress Notes (Signed)
Outpatient Physical Therapy  [] [facility]    Phone: [phone]   Fax: [phone]   [x] [facility]  Phone: [phone]   Fax: [phone]  [] [facility]              Phone: [phone]   Fax: [phone]     Physical Therapy Discharge Note  Date: 04/25/2016        Patient Name:  Harold Gordon    DOB:  [birth year]  MRN: [id number]  Restrictions/Precautions:    Pertinent Medical History: DDD-Lumbar, Asthma, Tinnitus, Sinusitis, Back Surgery L4-5 1989  Medical/Treatment Diagnosis Information:   Diagnosis: Brachial Plexopathy, DDD-cervical   Treatment Diagnosis: Decreased Cervical AROM and radiating pain affecting ADLs  Insurance/Certification information:  PT Insurance Information: [facility] [employer]  Physician Information:  Referring Practitioner: Dr. Derrel Nip  Plan of care signed (Y/N):    Visit# / total visits:   4/12  Pain level:      2-5/10       G-Code (if applicable):      Date G-Code Applied:        Time Period for Report:  3/22 to 04/13/16  Cancels/No-shows to date:  0    Plan of Care/Treatment to date:  [x] Therapeutic Exercise    [x] Modalities:  [x] Therapeutic Activity     [] Ultrasound  [] Electrical Stimulation  [] Gait Training      [x] Cervical Traction    [] Lumbar Traction  [] Neuromuscular Re-education  [] Cold/hotpack [] Iontophoresis  [x] Instruction in HEP      Other:  [x] Manual Therapy       []    [] Aquatic Therapy       []                    ?      Significant Findings At Last Visit/Comments:    Objective:  Test measurements:  4/10: Cervical AROM: Rotation R 55 and L 50. Flex 60 and Ext 35. Sidebend R 35 and L 25       Assessment:  Summary: Pt reported some improvement in pain in shoulder blade area with PT treatment but tingling in his left hand remained the same. Pt improved cervical AROM slightly as well. Pt came for 4 visits then decided not to stop, pt now discharged.  Patient's response to treatment: Fair    Progress towards goals:    Short term goals  Time Frame for Short  term goals: 2 weeks  Short term goal 1: Pt will show independence in HEP- MET  Short term goal 2: Pt will report no radiating symptoms in L UE- PART MET- pain improved but not the tingling  Long term goals  Time Frame for Long term goals : 4 weeks  Long term goal 1: Pt will report <3/10 pain consistently with no radiating pain so he can return to work ADLs- NOT MET  Long term goal 2: Pt will show full cervical AROM without pain- NOT MET  Patient Goals   Patient goals : "relieve pain"- PART MET    Current Frequency/Duration:  # Days per week: [] 1 day # Weeks: [] 1 week [] 4 weeks      [x] 2 days?   [] 2 weeks [] 5 weeks      [] 3 days   [] 3 weeks [x] 6 weeks     Rehab Potential: [] Excellent [] Good [x] Fair  [] Poor  Goal Status:  [] Achieved [x] Partially Achieved  [] Not Achieved     Patient Status: [] Continue per initial plan of Care     [x] Patient now discharged     [] Additional visits requested, Please re-certify for additional visits:      Requested frequency/duration:  X/week for weeks    Electronically signed by:  Claiborne Billings, PT    If you have any questions or concerns, please don't hesitate to call.  Thank you for your referral.    Physician Signature:________________________________Date:__________________  By signing above, therapist's plan is approved by physician

## 2016-05-01 ENCOUNTER — Encounter: Primary: Family Medicine

## 2016-05-02 ENCOUNTER — Inpatient Hospital Stay: Admit: 2016-05-02 | Attending: Family Medicine | Primary: Family Medicine

## 2016-05-02 ENCOUNTER — Encounter

## 2016-05-02 DIAGNOSIS — Z129 Encounter for screening for malignant neoplasm, site unspecified: Secondary | ICD-10-CM

## 2016-05-04 NOTE — Telephone Encounter (Signed)
Patient is calling he would like to get his CT of chest and blood work results called to him. Please give him a call back.

## 2016-05-04 NOTE — Telephone Encounter (Signed)
CALLED PT AND HE HAS ALREADY RECEIVED RESULTS BUT WANTS THEM EMAILED TO [id number]

## 2016-05-12 ENCOUNTER — Encounter: Attending: Family | Primary: Family Medicine

## 2016-05-15 ENCOUNTER — Ambulatory Visit
Admit: 2016-05-15 | Discharge: 2016-05-15 | Payer: PRIVATE HEALTH INSURANCE | Attending: Family | Primary: Family Medicine

## 2016-05-15 DIAGNOSIS — J4541 Moderate persistent asthma with (acute) exacerbation: Secondary | ICD-10-CM

## 2016-05-15 LAB — POCT GLYCOSYLATED HEMOGLOBIN (HGB A1C): Hemoglobin A1C: 5.3 %

## 2016-05-15 MED ORDER — ETODOLAC 400 MG PO TABS
400 MG | ORAL_TABLET | Freq: Two times a day (BID) | ORAL | 1 refills | Status: DC
Start: 2016-05-15 — End: 2017-05-18

## 2016-05-15 NOTE — Progress Notes (Signed)
Harold Gordon  56 y.o. male    1960/09/07      CC: asthma - hospital follow up and CT follow up.    Chief Complaint   Patient presents with   . Follow-Up from Hospital     ROUTINE FOLLOW UP FROM HOSP- ER ON 4/14- ADMITTED 4/15 THROUGH 04/20/16 FOR ASTHMA. NOW ON SYMBICORT- FEELS LIKE IS DOING BETTER. NO CONCERNS    . Hand Pain     ATRHRITIS BILAT HANDS- WORSE IN RIGHT, CANNOT MAKE A CLOSED FIST. USING VOLTAREN GEL BUT WOULD LIKE TO DISCUSS OTHER OPTIONS. NO ORAL MEDS.        HPI   Was on taper of prednisone for 5 days.   No antibiotics.   Started on Symbicort.    Cardiologist increased pravastatin from 10- to 20 mg daily.    Had CT and needs follow up.  Has mild emysema.    Breathing is good.    Has not had to use rescue since left the hospital.    Did wear it out the day he went to the hospital.    The allergies were terrible - was cutting grass.   Tried rescue prior to going to hospital  Went to Er initially and sent home.  Then tried Albuterol and drove to Er and then admitted for the asthma and then [facility] to [facility] and there for a couple of days.    Right hand with swelling and pain - had injury to the right hand in the past.  Cannot close hand fully.   Patient was a smoker for 20-25 years, and you 25 years ago.  He uses cigars, was 1 pack in 1 1/1/2 days smoker.   He has quit cigar use since had the CT results with emphysema.     No Known Allergies    Physical  Examination    Physical Exam     Constitutional: Oriented to person, place, and time and well-developed, well-nourished, and in no distress. No distress.   Neck: Carotid bruit is not present.   Cardiovascular: Normal rate, regular rhythm and normal heart sounds.  Exam reveals no gallop and no friction rub.    No murmur heard.  Pulmonary/Chest: Effort normal and breath sounds normal. No respiratory distress. He has no wheezes. No rales. Exhibits no tenderness.   Musculoskeletal: Exhibits edema right hand in the knuckles - ROM limited with the arthritis. Has  pain in the joints. .   Neurological: Alert and oriented to person, place, and time.   Skin: Skin is warm and dry. No diaphoresis  Psychiatric: Mood, memory, affect and judgment normal.   Nursing note and vitals reviewed.      Vitals:    05/15/16 0902   BP: 132/82   Site: Right Arm   Position: Sitting   Cuff Size: Medium Adult   Pulse: 80   Resp: 16   Weight: 180 lb 6.4 oz (81.8 kg)   Height: 5\' 7"  (1.702 m)     Body mass index is 28.25 kg/m.     Wt Readings from Last 3 Encounters:   05/15/16 180 lb 6.4 oz (81.8 kg)   03/09/16 183 lb (83 kg)   01/28/16 181 lb (82.1 kg)     BP Readings from Last 3 Encounters:   05/15/16 132/82   03/09/16 120/78   01/28/16 130/76        Results for POC orders placed in visit on 05/15/16   POCT glycosylated hemoglobin (Hb  A1C)   Result Value Ref Range    Hemoglobin A1C 5.3 %       Assessment     Diagnosis Orders   1. Moderate persistent asthma with acute exacerbation     2. Elevated fasting glucose  POCT glycosylated hemoglobin (Hb A1C)   3. Elevated LFTs     4. Aortic atherosclerosis (HCC)     5. Mild emphysema (HCC)     6. Arthritis  etodolac (LODINE) 400 MG tablet   7. Fatty liver           Plan    Trial of lodine for arthritis. Has significant joint swelling right hand knuckles.   A1C today. This is normal.   Reviewed CT with patient line by line  He has increased his Pravastatin to 20 mg from 10 mg with cardiologist.   He has mild emphysema.   Has elevated LFT with Fatty liver infiltration - recommend exercise and weight loss.   Continue Symbicort for asthma.   Given Herbert Seta the information -   Fax CT chest form 2018 to  Dr [facility] Phone [phone];   and 2014 and 2018 to Dr [facility] in [place] [phone]    Return if symptoms worsen or fail to improve.    There are no Patient Instructions on file for this visit.

## 2016-07-19 MED ORDER — AMOXICILLIN 875 MG PO TABS
875 MG | ORAL_TABLET | Freq: Two times a day (BID) | ORAL | 0 refills | Status: AC
Start: 2016-07-19 — End: 2016-07-26

## 2016-07-19 NOTE — Telephone Encounter (Signed)
[place] currently in [place], has that he believes is a sinus infection  No fever, no real pain. Some discharge , yellow/green with some blood  Started last Thursday  Nasal passages are very inflamed, has used Afrin on and off with relief,but knows that is not to be used for longer than 3 days but very sparingly  Is asking if Dr Kennith CenterHines can call something in for him please  [facility] [place] Phone [phone]

## 2016-07-19 NOTE — Telephone Encounter (Signed)
High dose amoxil [place] to take as directed.  Suggest using probiotic or eating yogurt while on this.  Also push fluids and can use nasal steroid spray and/ or neti pot/ sinus rinse in lieu of afrin for congestion.

## 2016-07-19 NOTE — Telephone Encounter (Signed)
PT [facility] RX WAS SENT TO PHARMACY, GAVE PT DRH INSTRUCTIONS ALSO. KMW

## 2016-07-19 NOTE — Telephone Encounter (Signed)
LAST VISIT 03/09/2016, NEXT VISIT NONE.

## 2016-07-31 ENCOUNTER — Encounter: Payer: PRIVATE HEALTH INSURANCE | Attending: Family Medicine | Primary: Family Medicine

## 2016-08-17 MED ORDER — SYMBICORT 160-4.5 MCG/ACT IN AERO
RESPIRATORY_TRACT | 3 refills | Status: DC
Start: 2016-08-17 — End: 2016-12-24

## 2016-08-17 NOTE — Telephone Encounter (Signed)
LAST APT 05/15/16 WITH KRISTA NO APT SCHEDULED.

## 2016-09-06 ENCOUNTER — Inpatient Hospital Stay: Primary: Family Medicine

## 2016-12-25 MED ORDER — SYMBICORT 160-4.5 MCG/ACT IN AERO
RESPIRATORY_TRACT | 0 refills | Status: DC
Start: 2016-12-25 — End: 2017-01-10

## 2016-12-25 NOTE — Telephone Encounter (Signed)
LAST VISIT 03/09/2016, NEXT VISIT NONE.

## 2017-01-10 MED ORDER — BUDESONIDE-FORMOTEROL FUMARATE 160-4.5 MCG/ACT IN AERO
160-4.5 | RESPIRATORY_TRACT | 0 refills | Status: DC
Start: 2017-01-10 — End: 2017-03-26

## 2017-03-26 MED ORDER — BUDESONIDE-FORMOTEROL FUMARATE 160-4.5 MCG/ACT IN AERO
RESPIRATORY_TRACT | 0 refills | Status: DC
Start: 2017-03-26 — End: 2017-12-27

## 2017-03-26 NOTE — Telephone Encounter (Signed)
LAST APT 05/15/16 WITH KRISTA FOR HOSP F/U NO APT SCHEDULED

## 2017-04-19 ENCOUNTER — Encounter

## 2017-04-19 ENCOUNTER — Telehealth

## 2017-04-19 NOTE — Telephone Encounter (Signed)
ATTEMPTED TO CONTACT PATIENT, SOMEONE PICKED UP, THEN ALL I HEARD WAS BACKGROUND NOISE THAN PHONE WAS HUNG UP. SC

## 2017-04-19 NOTE — Telephone Encounter (Signed)
Referral done to pulmonology for asthma

## 2017-04-19 NOTE — Telephone Encounter (Signed)
Patient would like a referral to see Dr. Lyla Son, phone number - does not have phone number or fax number. - Asthma      Please give him a call back.

## 2017-04-24 NOTE — Telephone Encounter (Signed)
SPOKE TO PT AND ADVISED INSURANCE DOES NOT REQUIRE A REFERRAL IT WILL NOT EVEN LET ME PUT IT THROUGH HIS INSURANCE.    1 Active Policy Found   Member Results  Name  Mariann LasterRobert Yaun  Plan Description  CHOICE PLUS  Date of Birth  Feb 20, 1960  Group Number  [id number]  Gender  M  Effective - Term Dates  01/02/2017 - -  Plan and Provider Verification  Verifying Referral Requirements  Member's plan does not require a referral.

## 2017-04-24 NOTE — Telephone Encounter (Signed)
Patient had called last week and asked for a referral, this needs to be done through his insurance. [employer]  PROVIDER NAME:  Dr Lyla Son  PROVIDER ADDRESS:  [address]  PROVIDER [place], [place], ZIP:  [place] , [place], [address]  PROVIDER PHONE:  [phone]  PROVIDER FAX:    PROVIDER NPI:    PROVIDER TAX I.D.:      DX CODE:  asthma    CPT CODE:  Consult     NUMBER OF VISITS:  12    DATE OF SERVICE:  No appt yet    CALLER NAME:  [name]

## 2017-04-25 NOTE — Telephone Encounter (Signed)
Patient is calling back to say he just needs a referral from Dr Kennith Center to Dr Laurine Blazer.  Please send the last two reports of his MRI.    PROVIDER NAME:  Dr Lyla Son  PROVIDER ADDRESS:  [address]  PROVIDER [place], [place], ZIP:  [place] , [place], [address]  PROVIDER PHONE:  [phone]  Pt did not know the fax number.      PROVIDER TAX I.D.:    ??  DX CODE:  asthma  ??  CPT CODE:  Consult    ??  NUMBER OF VISITS:  12  ??  DATE OF SERVICE:  No appt yet  ??  CALLER NAME:  [name]

## 2017-04-25 NOTE — Telephone Encounter (Signed)
SPOKE TO PT AND ADVISED REFERRAL WAS ALREADY DONE BUT WILL REFAC WITH HIS IMAGINE. THEY WERE NOT MRIS. HAD AN XRAY AND CT OF CHEST DONE IN 2018. ALL FAXED TO DR EISENTROUTS OFFICE.

## 2017-05-17 NOTE — Telephone Encounter (Signed)
WILL CLOSE MESSAGE AND ADVISE AT APPT TOMORROW. HS

## 2017-05-17 NOTE — Telephone Encounter (Signed)
PT HAS AN APPT TOMORROW WITH CHARLENE AT 7:40AM. HS

## 2017-05-17 NOTE — Telephone Encounter (Signed)
PLEASE REFER HIM TO A ENT  - YOU REFERRED HIM ABOUT 4 OR 5 YRS AGO - HE WOULD LIKE TO GO TO THE SAME ONE    PT @  [phone]

## 2017-05-17 NOTE — Telephone Encounter (Signed)
CALLED PT, NO ANSWER AND NO VM SET UP.   NEED TO KNOW FOR WHAT

## 2017-05-18 ENCOUNTER — Ambulatory Visit
Admit: 2017-05-18 | Discharge: 2017-05-18 | Payer: PRIVATE HEALTH INSURANCE | Attending: Family | Primary: Family Medicine

## 2017-05-18 DIAGNOSIS — J029 Acute pharyngitis, unspecified: Secondary | ICD-10-CM

## 2017-05-18 LAB — POCT RAPID STREP A: Strep A Ag: NOT DETECTED

## 2017-05-18 MED ORDER — AZITHROMYCIN 250 MG PO TABS
250 MG | ORAL_TABLET | ORAL | 0 refills | Status: AC
Start: 2017-05-18 — End: 2017-05-23

## 2017-05-18 NOTE — Progress Notes (Signed)
azithromycinSUBJECTIVE:    Patient ID: Harold Gordon is a 57 y.o. y.o. male.    HPI  Sore throat for the last two days hurts to swallow worse with  Coughing feels like someone stabbed him in the throat - has been drinking chloraseptic but it has not helped- no fevers -ear pain noted    Pt has ha d a persistent cough for the last year- worse in am feels like stuff is stuck in his throat ( phlegm) he has been on Zantac for 30 days and it has not helped - CT indicates emphysema -has a pulmonologist - would like to see the ENT MD  Review of Systems   Constitutional: Negative for fever.   HENT: Positive for sore throat. Negative for ear pain.    Respiratory: Positive for cough. Negative for shortness of breath and wheezing.    All other systems reviewed and are negative.       OBJECTIVE:       Physical Exam   Constitutional: He appears well-developed and well-nourished. He is active.   HENT:   Mouth/Throat: Uvula is midline. Posterior oropharyngeal erythema present.   Cardiovascular: Normal rate, regular rhythm, S1 normal, S2 normal and normal heart sounds. Exam reveals no gallop, no S3, no S4, no distant heart sounds and no friction rub.   No murmur heard.   No systolic murmur is present.   No diastolic murmur is present.  Pulmonary/Chest: Effort normal and breath sounds normal.   Lymphadenopathy:        Head (right side): No submental, no submandibular and no tonsillar adenopathy present.        Head (left side): No submental, no submandibular and no tonsillar adenopathy present.   Neurological: He is alert.   Psychiatric: He has a normal mood and affect. His speech is normal and behavior is normal. Judgment and thought content normal. Cognition and memory are normal.     Harold Gordon was seen today for pharyngitis.    Diagnoses and all orders for this visit:    Sore throat  -     POCT rapid strep A- negative  azithromycin as prescribed  Instructions for Respiratory Infections (SAVE THIS SHEET)   For the first 7-14 days of  symptoms follow instructions below, even before being seen in the office or even during treatment with antibiotics, until symptom free.   1. Water: Drink 1 ounce of water for every 2 pounds of body weight for adults need 64 Ounces of water per day. This will loosen mucus in the head and chest & improve the weak feeling of dehydration, allow the body to get germ fighting resources to the infection. Half can be juice or sugar free Crystal Light. Don't count drinks with caffeine or carbonation. Infants can have Pedialyte liquid or freezer pops. Avoid salt if you have high Blood Pressure, swelling in the feet or ankles or have heart problems.   2. Humidity: Humidify the air to 35-50% ( or until the windows fog over slightly). Can use a humidifier, vaporizer, boil water on the stove or put a coffee can full of water on the heater vents. This will loosen mucus from infections and allergies.   3. Sleep: Get 8-10 hours a night and rest during the evening after work or school. If you have trouble sleeping, adults can take Melatonin  up to 3 tabs at bedtime ( not for children or pregnant women). If Mono is suspected then sleep during 9PM to 9AM time span (  if possible.)   4.Cough: Take cough medicines with Guaifenesin ( to loosen chest or head congestion) and Dextromethorphan ( to decrease excess cough). Robitussin D.M. Syrup every 4-6 hrs or Mucinex D.M. Pills twice a day. Use the pediatric formulations for children over 6 months making sure they are alcohol & sugar free for children, pregnant women, and diabetics.   5. Pain And Fevers: Take Acetaminophen ( Tylenol) for fevers, aches, and headaches. 2-500 mg every 8 hours for adults. Appropriate doses at bedtime for children may help them sleep better. If pregnant take 1 -500 mg. (Tylenol) every 8 hours as needed. Ibuprofen may be used if not pregnant, but should be given with food to avoid nausea. Avoid Ibuprofen if you have high blood pressure, CHF, or kidney problems.    6.Gargle: Gargle in the back of the throat with the head tilted back and to the sides with a strong mouthwash ( Listerine or Scope) after meals and at bedtime at least 4 -5 times a day. This helps kill bacteria and viruses in the back of the throat and will shorten the duration and decrease the severity of your symptoms: sore throat, cough, ear popping,/ear pain, and possibly dizziness.   7. Smoking: Avoid smoking or exposure to second hand smoke.   8. Zinc: Zinc lozenges such as Cold Eze, or Basic will help shorten the duration and lessen symptoms such as sore throat, cough, nasal congestion, runny nose, and post nasal drip. Use 1 lozenge every 2-4 hours ( after meals if stomach is sensitive). Children can use 10-15 mg. Or less 3-4 times a day or Zinc lollypops. In pregnancy limit to 50-60 mg. A day for 7 days as prenatals have Zinc also. With diarrhea use zinc pills 50 mg 1/2 to 1 pill 2x/day.   9. Vitamins: Vitamin C 500 mg. With breakfast and dinner. Children and pregnant women should drink citrus juices. This speeds healing and strengthens immune system.   10. Chest Symptoms: Vicks Vapor rub to the chest at bedtime.   11. Decongestants: Avoid all decongestants and antihistamine cold preparations in children.   Try all of the above starting with day 1 of symptoms. If Strep throat symptoms appear call to be seen in the office as soon as possible and don't gargle on that day. Newborns, infants, or anyone with earaches or influenza may need to be seen quickly. Adults with fevers over 103 degrees or shortness of breath should call the office immediately.   - doxycycline (ADOXA) 100 MG tablet; Take 1 tablet by mouth 2 times daily for 7 days. Use if not getting better.   Tobacco abuse   Cut back as much as you can.        Cough  Throat fullness  -     Ambulatory referral to ENT- Dr  Marlou Sa

## 2017-05-18 NOTE — Patient Instructions (Signed)
Patient Education        Sore Throat: Care Instructions  Your Care Instructions    Infection by bacteria or a virus causes most sore throats. Cigarette smoke, dry air, air pollution, allergies, and yelling can also cause a sore throat. Sore throats can be painful and annoying. Fortunately, most sore throats go away on their own. If you have a bacterial infection, your doctor may prescribe antibiotics.  Follow-up care is a key part of your treatment and safety. Be sure to make and go to all appointments, and call your doctor if you are having problems. It's also a good idea to know your test results and keep a list of the medicines you take.  How can you care for yourself at home?  ?? If your doctor prescribed antibiotics, take them as directed. Do not stop taking them just because you feel better. You need to take the full course of antibiotics.  ?? Gargle with warm salt water once an hour to help reduce swelling and relieve discomfort. Use 1 teaspoon of salt mixed in 1 cup of warm water.  ?? Take an over-the-counter pain medicine, such as acetaminophen (Tylenol), ibuprofen (Advil, Motrin), or naproxen (Aleve). Read and follow all instructions on the label.  ?? Be careful when taking over-the-counter cold or flu medicines and Tylenol at the same time. Many of these medicines have acetaminophen, which is Tylenol. Read the labels to make sure that you are not taking more than the recommended dose. Too much acetaminophen (Tylenol) can be harmful.  ?? Drink plenty of fluids. Fluids may help soothe an irritated throat. Hot fluids, such as tea or soup, may help decrease throat pain.  ?? Use over-the-counter throat lozenges to soothe pain. Regular cough drops or hard candy may also help. These should not be given to young children because of the risk of choking.  ?? Do not smoke or allow others to smoke around you. If you need help quitting, talk to your doctor about stop-smoking programs and medicines. These can increase your  chances of quitting for good.  ?? Use a vaporizer or humidifier to add moisture to your bedroom. Follow the directions for cleaning the machine.  When should you call for help?  Call your doctor now or seek immediate medical care if:  ?? ?? You have new or worse trouble swallowing.   ?? ?? Your sore throat gets much worse on one side.   ??Watch closely for changes in your health, and be sure to contact your doctor if you do not get better as expected.  Where can you learn more?  Go to [email or web address] and sign in to your MyChart account. Enter [phone] in the Search Health Information box to learn more about "Sore Throat: Care Instructions."     If you do not have an account, please click on the "Sign Up Now" link.  Current as of: October 22, 2016  Content Version: 12.0  ?? 2006-2019 [employer]. Care instructions adapted under license by [facility]. If you have questions about a medical condition or this instruction, always ask your healthcare professional. [employer] disclaims any warranty or liability for your use of this information.         Patient Education        Chronic Cough: Care Instructions  Your Care Instructions    A cough is your body's response to something that bothers your throat or airways. Many things can cause a cough. You might cough because of a  cold or the flu, bronchitis, or asthma. Smoking, postnasal drip, allergies, and stomach acid that backs up into your throat also can cause a cough.  A cough can be short-term (acute) or long-term (chronic). A chronic cough lasts more than 3 weeks. A chronic cough is often caused by a long-term problem, such as asthma. Another cause might be a medicine, such as an ACE inhibitor.  A cough is a symptom, not a disease. To treat a chronic cough, you may need to treat the problem that causes it. You can take a few steps at home to cough less and feel better.  Some people cough or clear their throat out of habit for no  clear reason.  Follow-up care is a key part of your treatment and safety. Be sure to make and go to all appointments, and call your doctor if you are having problems. It's also a good idea to know your test results and keep a list of the medicines you take.  How can you care for yourself at home?  ?? Drink plenty of water and other fluids. This may help soothe a dry or sore throat. Honey or lemon juice in hot water or tea may ease a dry cough.  ?? Prop up your head on pillows to help you breathe and ease a cough.  ?? Do not smoke or allow others to smoke around you. Smoke can make a cough worse. If you need help quitting, talk to your doctor about stop-smoking programs and medicines. These can increase your chances of quitting for good.  ?? Avoid exposure to smoke, dust, or other pollutants, or wear a face mask. Check with your doctor or pharmacist to find out which type of face mask will give you the most benefit.  ?? Take cough medicine as directed by your doctor.  ?? Try cough drops to soothe a dry or sore throat. Cough drops don't stop a cough. Medicine-flavored cough drops are no better than candy-flavored drops or hard candy.  Throat clearing  When you have a chronic cough or a disease that may cause this type of cough, you may often feel like you want to clear your throat. This helps bring up mucus. But throat clearing does not always have a cause.  Throat clearing can become a habit. The more you do it, the more you feel like you need to do it. But frequent throat clearing can be hard on your vocal cords. It's like slamming them together.  To help lessen throat clearing, you can try:  ?? Taking small sips of water.  ?? Not clearing your throat when you feel you need to.  ?? Swallowing hard when you want to clear your throat.  You may want to ask your doctor if a medicine that thins mucus would help.  When should you call for help?  Call 911 anytime you think you may need emergency care. For example, call if:  ?? ?? You  have severe trouble breathing.   ??Call your doctor now or seek immediate medical care if:  ?? ?? You cough up blood.   ?? ?? You have new or worse trouble breathing.   ?? ?? You have a new or higher fever.   ??Watch closely for changes in your health, and be sure to contact your doctor if:  ?? ?? You cough more deeply or more often, especially if you notice more mucus or a change in the color of your mucus.   ?? ?? You  do not get better as expected.   Where can you learn more?  Go to [email or web address] and sign in to your MyChart account. Enter [phone] in the Search Health Information box to learn more about "Chronic Cough: Care Instructions."     If you do not have an account, please click on the "Sign Up Now" link.  Current as of: September 06, 2016  Content Version: 12.0  ?? 2006-2019 [employer]. Care instructions adapted under license by [facility]. If you have questions about a medical condition or this instruction, always ask your healthcare professional. [employer] disclaims any warranty or liability for your use of this information.    Instructions for Respiratory Infections (SAVE THIS SHEET)   For the first 7-14 days of symptoms follow instructions below, even before being seen in the office or even during treatment with antibiotics, until symptom free.   1. Water: Drink 1 ounce of water for every 2 pounds of body weight for adults need 64 Ounces of water per day. This will loosen mucus in the head and chest & improve the weak feeling of dehydration, allow the body to get germ fighting resources to the infection. Half can be juice or sugar free Crystal Light. Don't count drinks with caffeine or carbonation. Infants can have Pedialyte liquid or freezer pops. Avoid salt if you have high Blood Pressure, swelling in the feet or ankles or have heart problems.   2. Humidity: Humidify the air to 35-50% ( or until the windows fog over slightly). Can use a humidifier, vaporizer,  boil water on the stove or put a coffee can full of water on the heater vents. This will loosen mucus from infections and allergies.   3. Sleep: Get 8-10 hours a night and rest during the evening after work or school. If you have trouble sleeping, adults can take Melatonin  up to 3 tabs at bedtime ( not for children or pregnant women). If Mono is suspected then sleep during 9PM to 9AM time span (if possible.)   4.Cough: Take cough medicines with Guaifenesin ( to loosen chest or head congestion) and Dextromethorphan ( to decrease excess cough). Robitussin D.M. Syrup every 4-6 hrs or Mucinex D.M. Pills twice a day. Use the pediatric formulations for children over 6 months making sure they are alcohol & sugar free for children, pregnant women, and diabetics.   5. Pain And Fevers: Take Acetaminophen ( Tylenol) for fevers, aches, and headaches. 2-500 mg every 8 hours for adults. Appropriate doses at bedtime for children may help them sleep better. If pregnant take 1 -500 mg. (Tylenol) every 8 hours as needed. Ibuprofen may be used if not pregnant, but should be given with food to avoid nausea. Avoid Ibuprofen if you have high blood pressure, CHF, or kidney problems.   6.Gargle: Gargle in the back of the throat with the head tilted back and to the sides with a strong mouthwash ( Listerine or Scope) after meals and at bedtime at least 4 -5 times a day. This helps kill bacteria and viruses in the back of the throat and will shorten the duration and decrease the severity of your symptoms: sore throat, cough, ear popping,/ear pain, and possibly dizziness.   7. Smoking: Avoid smoking or exposure to second hand smoke.   8. Zinc: Zinc lozenges such as Cold Eze, or Basic will help shorten the duration and lessen symptoms such as sore throat, cough, nasal congestion, runny nose, and post nasal drip. Use 1 lozenge every 2-4  hours ( after meals if stomach is sensitive). Children can use 10-15 mg. Or less 3-4 times a day or Zinc  lollypops. In pregnancy limit to 50-60 mg. A day for 7 days as prenatals have Zinc also. With diarrhea use zinc pills 50 mg 1/2 to 1 pill 2x/day.   9. Vitamins: Vitamin C 500 mg. With breakfast and dinner. Children and pregnant women should drink citrus juices. This speeds healing and strengthens immune system.   10. Chest Symptoms: Vicks Vapor rub to the chest at bedtime.   11. Decongestants: Avoid all decongestants and antihistamine cold preparations in children.   Try all of the above starting with day 1 of symptoms. If Strep throat symptoms appear call to be seen in the office as soon as possible and don't gargle on that day. Newborns, infants, or anyone with earaches or influenza may need to be seen quickly. Adults with fevers over 103 degrees or shortness of breath should call the office immediately.   - doxycycline (ADOXA) 100 MG tablet; Take 1 tablet by mouth 2 times daily for 7 days. Use if not getting better.   Tobacco abuse   Cut back as much as you can.

## 2017-05-31 ENCOUNTER — Ambulatory Visit
Admit: 2017-05-31 | Discharge: 2017-05-31 | Payer: PRIVATE HEALTH INSURANCE | Attending: Plastic Surgery within the Head & Neck | Primary: Family Medicine

## 2017-05-31 DIAGNOSIS — J309 Allergic rhinitis, unspecified: Secondary | ICD-10-CM

## 2017-05-31 MED ORDER — MONTELUKAST SODIUM 10 MG PO TABS
10 MG | ORAL_TABLET | Freq: Every evening | ORAL | 1 refills | Status: DC
Start: 2017-05-31 — End: 2017-08-14

## 2017-05-31 NOTE — Progress Notes (Signed)
SUBJECTIVE:    Chief Complaint   Patient presents with   . Other     Constant cough, nasal drip.        Harold Gordon is a 57 y.o. male    The patient relates that several years ago he had clear nasal drainage was then produced a cough.  He continues to notice clear drainage which does precipitate a cough worse in the mornings.  He was seen by a pulmonologist who felt he had mild COPD and has been placing him on medications which unfortunately have not resulted in significant improvement.  These medications includes Symbicort and another rescue inhaler.  He has tried Care[employer] adviser with no success.  He's had no fever.  He has had a history of spring and fall allergy symptoms but now his current problem seems to be year-round.  Flonase nasal spray has also produced no particular benefit.  He has not smoked in 25 years and has had no recent fever.  No other constitutional symptoms of been noted.     Past Medical History:   Diagnosis Date   . Allergic rhinitis    . Asthma     ALLERGY INDUCED ASTHMA   . DDD (degenerative disc disease), lumbar 05/16/2010   . Elevated LFTs    . Environmental allergies 01/26/2012   . Hearing loss of both ears 05/22/2012   . Hypercholesteremia 05/16/2010   . 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 Nasal turbinate hypertrophy 07/05/2012   . 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 Palpitations 05/16/2010   . Perennial sinusitis 01/26/2012   . Sinusitis, chronic 04/17/2012   . Tinnitus, subjective 05/22/2012      Past Surgical History:   Procedure Laterality Date   . APPENDECTOMY  AGE 40   . BACK SURGERY  1989    L4-L5      Family History   Problem Relation Age of Onset   . Heart Disease Father 44        CHF   . Diabetes Maternal Grandmother    . Other Maternal Grandfather         LUNG DISEASE   . Diabetes Paternal Grandmother       Social History     Tobacco Use   . Smoking status: Former Smoker     Packs/day: 1.00     Years: 8.00     Pack years: 8.00     Types: Cigars, Cigarettes     Start date:  05/22/2008     Last attempt to quit: 05/18/2016     Years since quitting: 1.0   . Smokeless tobacco: Never Used   . Tobacco comment: quit cigarettes 30 years ago, started in 1975    Substance Use Topics   . Alcohol use: Yes     Alcohol/week: 0.0 oz     Comment: couple cans a night         Review of Systems:  Review of Systems   Constitutional: Negative for fever and unexpected weight change.   HENT: Positive for postnasal drip, rhinorrhea, sinus pressure and sneezing. Negative for congestion, dental problem, drooling, ear discharge, ear pain, facial swelling, hearing loss, mouth sores, nosebleeds, sinus pain, sore throat, tinnitus, trouble swallowing and voice change.    Eyes: Negative.    Respiratory: Positive for cough and shortness of breath. Negative for wheezing and stridor.    Cardiovascular: Negative.    Endocrine: Negative.    Skin: Negative.    Allergic/Immunologic:  Positive for environmental allergies.   Neurological: Negative.    Hematological: Negative.    Psychiatric/Behavioral: Negative.        OBJECTIVE:  BP 138/76   Pulse 84   SpO2 97%   Physical Exam   Constitutional: He is oriented to person, place, and time. He appears well-developed and well-nourished.   HENT:   Head: Normocephalic and atraumatic.   Right Ear: External ear normal.   Left Ear: External ear normal.   Mouth/Throat: Oropharynx is clear and moist. No oropharyngeal exudate.   Indirect laryngoscopy reveals entirely normal findings.  There is some mild inflammation in the subglottic area but not significant.Nasal mucosa treated with cotton pledgets  impregnated with Afrin and lidocaine placed in middle meatuses against turbinates.Pledgets left in place for five minutes and removed enabling enhanced visualization. The exam revealed positive edema of mucosa.it was negative for erythema indicative of infection.There wast evidence of deviation of the septum which was not significant.There were not polyps present..There were not other masses  present.The turbinates were not enlarged beyond normal.     Eyes: Pupils are equal, round, and reactive to light. Conjunctivae and EOM are normal.   Neck: Normal range of motion. Neck supple. No tracheal deviation present. No thyromegaly present.   Cardiovascular: Normal rate and regular rhythm.   Pulmonary/Chest: Effort normal.   Lymphadenopathy:     He has no cervical adenopathy.   Neurological: He is alert and oriented to person, place, and time.   Skin: Skin is warm and dry.   Psychiatric: He has a normal mood and affect. His behavior is normal. Judgment and thought content normal.        ASSESSMENT:    I have reviewed previous CT scan done in 2014 which did demonstrate some sinus problem and not of severity necessary for any surgery.    PLAN:     I discussed at length with the patient that I do feel that a CT scan of the sinuses to evaluate now his current sinus problem would be the best approach.  In the interim, we will start him on Singulair.  He will continue his current medications.    Dellie Catholic, MD

## 2017-06-01 NOTE — Other (Unsigned)
Subjective  Harold Gordon  1960-10-22  Chief Complaint  Patient presents with  ?????? Cardiovascular Follow-up        Harold Gordon comes in today for scheduled one year follow up.  He does have   CAD  and coronary calcium score of 18 that was done several years ago.  He is   treated  for hyperlipidemia and is on Crestor 10 mg a day.  His lipid profile is   actually  quite good.  We will have this repeated again in one year.  He denies chest  pain, shortness of breath.  He has had no palpitations since I last saw him.    He  remains active during his work hours since he works in [employer].  He is  interested in having another calcium score so I will arrange for this to be  done.  This will allow [place] to determine the progression of his coronary artery  disease.      Patient's Medications  Current Medications   ALBUTEROL (ALBUTEROL) 90 MCG/ACTUATION IN HFAA    Take 2 Puffs by inhalation.      Order Dose: 2 Puffs   ASPIRIN 81 MG TABLET, DELAYED RELEASE (E.C.)    Take 81 mg by mouth daily.      Order Dose: 81 mg   BUDESONIDE-FORMOTEROL (SYMBICORT) 160-4.5 MCG/ACTUATION HFA AEROSOL INHALER  Take  by inhalation.      Order Dose: --   CETIRIZINE (ZYRTEC) 10 MG TABLET    Take 10 mg by mouth daily.      Order Dose: 10 mg   OMEGA-3S/DHA/EPA/FISH OIL (OMEGA 3 PO)    Take  by mouth daily.      Order Dose: --   ROSUVASTATIN (CRESTOR) 10 MG TABLET    TAKE 1 TABLET BY MOUTH EVERY DAY IN   THE  EVENING      Order Dose: --      Discontinued Medications   PRAVASTATIN (PRAVACHOL) 10 MG TABLET    TAKE 2 TABS BY MOUTH EVERY EVENING.      Notes: --            Review of Systems  Constitutional: Negative.  HENT: Negative.  Eyes: Negative.  Respiratory: Negative.  Cardiovascular: Negative.  Gastrointestinal: Negative.  Genitourinary: Negative.  Musculoskeletal: Negative.  Skin: Negative.  Neurological: Negative.  Endo/Heme/Allergies: Negative.  Psychiatric/Behavioral: Negative.  All other systems reviewed and are negative.          Vitals:    06/01/17 1036 06/01/17 1037  BP:  130/84  Pulse:  85  Weight: 177 lb (80.3 kg)  Height:  (1.727 m)    Body mass index is 26.91 kg/m??????.    Physical Exam  Constitutional: He is well-developed, well-nourished, and in no distress.   Vital  signs are normal. No distress.  Neck: Normal carotid pulses, no hepatojugular reflux and no JVD present.   Carotid  bruit is not present.  Cardiovascular: Normal rate, regular rhythm, S1 normal, S2 normal, normal   heart  sounds, intact distal pulses and normal pulses.  No extrasystoles are present.  PMI is not displaced. Exam reveals no gallop, no S3, no S4 and no friction   rub.  No murmur heard.   No systolic murmur is present.   No diastolic murmur is present.  Pulmonary/Chest: Effort normal and breath sounds normal. No respiratory  distress. He has no wheezes.  Abdominal: Normal appearance, normal aorta and bowel sounds are normal. There  is  no tenderness. There is no CVA tenderness.  Skin: He is not diaphoretic.  Psychiatric: Mood, memory, affect and judgment normal.  Vitals reviewed.        IMPRESSION:      ICD-10-CM  1. Coronary artery disease involving native coronary artery of native heart  without angina pectoris I25.10  2. Elevated coronary artery calcium score R93.1  3. Dyslipidemia E78.5  4. Palpitations R00.2        PLAN AND RECOMMENDATION:    1.  Return office visit in one year.  2.  Fasting lipid profile and liver enzymes prior to return in one year.  3.  Schedule CT calcium score at his request.  4.  Encouraged him to remain active with exercise, on a regular basis to  maintain cardiovascular fitness and keep his weight under control.

## 2017-06-07 ENCOUNTER — Encounter

## 2017-06-07 ENCOUNTER — Inpatient Hospital Stay: Admit: 2017-06-07 | Payer: PRIVATE HEALTH INSURANCE | Primary: Family Medicine

## 2017-06-07 DIAGNOSIS — J329 Chronic sinusitis, unspecified: Secondary | ICD-10-CM

## 2017-08-14 ENCOUNTER — Encounter

## 2017-08-14 MED ORDER — MONTELUKAST SODIUM 10 MG PO TABS
10 MG | ORAL_TABLET | ORAL | 1 refills | Status: DC
Start: 2017-08-14 — End: 2017-09-13

## 2017-08-14 NOTE — Telephone Encounter (Signed)
RF  LOV 05/31/17

## 2017-08-20 MED ORDER — BUDESONIDE-FORMOTEROL FUMARATE 160-4.5 MCG/ACT IN AERO
RESPIRATORY_TRACT | 0 refills | Status: DC
Start: 2017-08-20 — End: 2017-09-12

## 2017-09-12 MED ORDER — BUDESONIDE-FORMOTEROL FUMARATE 160-4.5 MCG/ACT IN AERO
RESPIRATORY_TRACT | 0 refills | Status: DC
Start: 2017-09-12 — End: 2017-10-07

## 2017-09-13 ENCOUNTER — Encounter

## 2017-09-13 MED ORDER — MONTELUKAST SODIUM 10 MG PO TABS
10 MG | ORAL_TABLET | ORAL | 0 refills | Status: DC
Start: 2017-09-13 — End: 2017-10-07

## 2017-09-13 NOTE — Telephone Encounter (Signed)
LOV 05/31/17

## 2017-10-07 ENCOUNTER — Encounter

## 2017-10-08 MED ORDER — BUDESONIDE-FORMOTEROL FUMARATE 160-4.5 MCG/ACT IN AERO
RESPIRATORY_TRACT | 0 refills | Status: DC
Start: 2017-10-08 — End: 2017-11-02

## 2017-10-08 MED ORDER — MONTELUKAST SODIUM 10 MG PO TABS
10 MG | ORAL_TABLET | ORAL | 0 refills | Status: DC
Start: 2017-10-08 — End: 2017-10-12

## 2017-10-08 NOTE — Telephone Encounter (Signed)
LOV 05/31/17

## 2017-10-12 ENCOUNTER — Encounter

## 2017-10-12 MED ORDER — MONTELUKAST SODIUM 10 MG PO TABS
10 MG | ORAL_TABLET | Freq: Every evening | ORAL | 0 refills | Status: AC
Start: 2017-10-12 — End: ?

## 2017-10-12 NOTE — Telephone Encounter (Signed)
Pt requesting 90 RF  LOV 05/2017

## 2017-11-02 MED ORDER — BUDESONIDE-FORMOTEROL FUMARATE 160-4.5 MCG/ACT IN AERO
RESPIRATORY_TRACT | 1 refills | Status: DC
Start: 2017-11-02 — End: 2017-11-24

## 2017-11-26 MED ORDER — BUDESONIDE-FORMOTEROL FUMARATE 160-4.5 MCG/ACT IN AERO
RESPIRATORY_TRACT | 1 refills | Status: DC
Start: 2017-11-26 — End: 2017-12-27

## 2017-12-27 MED ORDER — BUDESONIDE-FORMOTEROL FUMARATE 160-4.5 MCG/ACT IN AERO
RESPIRATORY_TRACT | 1 refills | Status: DC
Start: 2017-12-27 — End: 2018-02-05

## 2018-02-05 MED ORDER — BUDESONIDE-FORMOTEROL FUMARATE 160-4.5 MCG/ACT IN AERO
RESPIRATORY_TRACT | 1 refills | Status: DC
Start: 2018-02-05 — End: 2018-04-02

## 2018-04-02 MED ORDER — BUDESONIDE-FORMOTEROL FUMARATE 160-4.5 MCG/ACT IN AERO
RESPIRATORY_TRACT | 1 refills | Status: DC
Start: 2018-04-02 — End: 2018-07-08

## 2018-05-02 ENCOUNTER — Encounter: Payer: PRIVATE HEALTH INSURANCE | Attending: Family Medicine | Primary: Family Medicine

## 2018-06-17 NOTE — Other (Unsigned)
Progress Notes by Janace Aris., MD at 06/17/18 [place]     Author:  Janace Aris., MD Service:  - Author Type:  Physician    Filed:  06/19/18 [place] Encounter Date:  06/17/2018 Status:  Signed    Editor:  Ferd Glassing.       Subjective   Harold Gordon  Jun 30, 1960  Chief Complaint   Patient presents with   ??????? CAD[GC.1]          Harold Gordon comes in today for scheduled one-year followup.  He was last   seen on 06/01/2017.  He has a history of coronary artery disease based on a   coronary calcium score of 18.  He was interested in a repeat calcium score   which was done last June and his calcium score has gone up to 91.  He has a   history of hyperlipidemia and is on Crestor; his cholesterol levels from a   year ago were quite good.  He is due for a lipid profile so I will check that   today while he is in the office fasted.  He does have a history of   palpitations but has not been bothered by these.  He has been checking his   blood pressure on a regular basis and it has been elevated with systolic   pressures in the 140s and 150s.  I get 154/86 in the office today with heart   rate of 81.  I have asked him to start lisinopril 10 mg per day, I will give   him a blood pressure log to keep, and I would like to see him back within the   next month to review his blood pressure readings and make further adjustments   to his antihypertensive regimen as needed.  I will also make sure he gets his   fasting lipid profile checked today and he will, of course, need continued   vigorous risk factor modification.[LW.1]      Patient's Medications   Current Medications    ALBUTEROL (ALBUTEROL) 90 MCG/ACTUATION IN HFAA    Take 2 Puffs by   inhalation.       Order Dose: 2 Puffs    ASPIRIN 81 MG TABLET, DELAYED RELEASE (E.C.)    Take 81 mg by mouth daily.       Order Dose: 81 mg    BUDESONIDE-FORMOTEROL (SYMBICORT) 160-4.5 MCG/ACTUATION HFA AEROSOL INHALER      Take  by inhalation.       Order Dose: --    CETIRIZINE  (ZYRTEC) 10 MG TABLET    Take 10 mg by mouth daily.       Order Dose: 10 mg    OMEGA-3S/DHA/EPA/FISH OIL (OMEGA 3 PO)    Take  by mouth daily.       Order Dose: --    ROSUVASTATIN (CRESTOR) 10 MG TABLET    TAKE 1 TABLET BY MOUTH EVERY DAY IN   THE EVENING       Order Dose: --[GC.1]               ROS   Review of Systems   Constitutional: Negative.    HENT: Negative.    Eyes: Negative.    Respiratory: Negative.    Cardiovascular: Negative.    Gastrointestinal: Negative.    Endocrine: Negative.    Genitourinary: Negative.    Musculoskeletal: Negative.    Skin: Negative.    Allergic/Immunologic: Negative.  Hematological: Negative.    Psychiatric/Behavioral: Negative.[MW.1]    All other systems reviewed and are negative[GC.2].[MW.1]          Objective   Vitals:    06/17/18 1516 06/17/18 1517   BP:  154/86   Pulse:  81   Weight: 164 lb (74.4 kg)    Height:  5\' 8"  (1.727 m)     Body mass index is 24.94 kg/m????.[GC.1]    Physical Exam[MW.1]  Vitals signs[GC.2] reviewed.   Constitutional:       General: He is[MW.1] not in acute distress[GC.2].     Appearance:[MW.1] Normal appearance[GC.2]. He is not[MW.1]   diaphoretic[GC.2].   Neck:      Vascular:[MW.1] Normal carotid pulses[GC.2]. No[MW.1] carotid   bruit[GC.2],[MW.1] hepatojugular reflux[GC.2] or[MW.1] JVD[GC.2].   Cardiovascular:      Rate and Rhythm:[MW.1] Normal rate[GC.2] and[MW.1] regular   rhythm[GC.2].[MW.1]  No extrasystoles[GC.2] are present.     Chest Wall:[MW.1] PMI is not displaced[GC.2].      Pulses:[MW.1] Normal pulses[GC.2].      Heart sounds:[MW.1] Normal heart sounds[GC.2],[MW.1] S1 normal[GC.2]   and[MW.1] S2 normal[GC.2].[MW.1] No murmur[GC.2]. No[MW.1] systolic[GC.2]   murmur. No[MW.1] diastolic[GC.2] murmur. No[MW.1] friction rub[GC.2]. No[MW.1]   gallop[GC.2]. No[MW.1] S3[GC.2] or[MW.1] S4[GC.2] sounds.    Pulmonary:      Effort: Pulmonary effort is[MW.1] normal[GC.2]. No[MW.1] respiratory   distress[GC.2].      Breath sounds: Normal[MW.1] breath  sounds[GC.2]. No[MW.1] wheezing[GC.2].   Abdominal:      General: Bowel sounds are[MW.1] normal[GC.2].      Tenderness: There is[MW.1] no abdominal tenderness[GC.2].   Psychiatric:         Judgment:[MW.1] Judgment[GC.2] normal.           IMPRESSION:[MW.1]      ICD-10-CM    1. Coronary artery disease involving native coronary artery of native heart   without angina pectoris  I25.10    2. Elevated coronary artery calcium score  R93.1    3. Palpitations  R00.2    4. Dyslipidemia  E78.5    5. SOB (shortness of breath)  R06.02    6. Chest pain syndrome  R07.9[GC.1]          PLAN AND RECOMMENDATION:[MW.1]  1.  Return office visit one month.  .    2.  Start lisinopril 10 mg per day.   3.  I have asked him to keep a blood pressure log on a daily basis and bring   these to is next office visit in one month.    4.  Draw fasting lipid profile and liver enzymes today while he is in the   office fasted.[LW.1]            Electronically signed by Jeanne IvanWilliams, Linda L. on 06/19/18 0737.   Attribution Key   GC.1 - Maxine Glennlarke, Gregory B., MD on 06/17/18 1523 GC.2 -   Maxine Glennlarke, Gregory B., MD on 06/17/18 1522 LW.1 - Jeanne IvanWilliams, Linda L. on 06/18/18   2054 MW.1 - Tildon HuskyWestrich, Melissa, MA on 06/17/18 1519         Revision History      Date/Time User Provider Type Action   > 06/19/18 0737   Jeanne IvanWilliams, Linda L. - Sign    06/17/18 1523 Maxine Glennlarke, Gregory B., MD Physician   Sign when Signing Visit    06/17/18 1519 Tildon HuskyWestrich, Melissa, MA Medical   Assistant Sign when Signing Visit

## 2018-06-20 ENCOUNTER — Encounter: Payer: PRIVATE HEALTH INSURANCE | Attending: Family Medicine | Primary: Family Medicine

## 2018-06-26 ENCOUNTER — Encounter: Payer: PRIVATE HEALTH INSURANCE | Attending: Family | Primary: Family Medicine

## 2018-07-08 MED ORDER — BUDESONIDE-FORMOTEROL FUMARATE 160-4.5 MCG/ACT IN AERO
RESPIRATORY_TRACT | 1 refills | Status: AC
Start: 2018-07-08 — End: ?

## 2018-07-22 NOTE — Other (Unsigned)
Progress Notes by Maxine Glennlarke, Gregory B., MD at 07/22/18 1345     Author:  Maxine Glennlarke, Gregory B., MD Service:  - Author Type:  Physician    Filed:  07/25/18 2106 Encounter Date:  07/22/2018 Status:  Signed    Editor:  Jeanne IvanWilliams, Linda L.       Subjective   Harold Gordon  [birth year]  Chief Complaint   Patient presents with   ??????? CAD[GC.1]          Harold Gordon[LW.1] comes in today for one-month followup.  He has a past   history of coronary artery disease based on an initial coronary calcium score   of 18.  The last time I saw him, he had a repeat calcium score that was 91.    He is treated for hypertension and hyperlipidemia.  He also has palpitations   which are under good control.  Last time I saw him, blood pressure was   elevated and he was started on lisinopril 10 mg per day.  Over the past month,   he had been keeping track of his blood pressures and they have been   consistently quite good, so we have made no new changes, I will leave him   lisinopril 10 mg per day and I have refilled his prescription.  He is also   taking Crestor 10 mg per day as well as fish oil.  His lipid profile is   actually quite good with a LDL of 53, HDL of 108, triglycerides are normal, as   are the liver enzymes.  I would like him to keep track of his blood pressure   several times per week over the next 6 months and I will see him back in 6   months and have him bring his blood pressure log with him.  We can repeat his   lipid profile in one year.[LW.2]      Patient's Medications   Current Medications    ALBUTEROL (ALBUTEROL) 90 MCG/ACTUATION IN HFAA    Take 2 Puffs by   inhalation.       Order Dose: 2 Puffs    ASPIRIN 81 MG TABLET, DELAYED RELEASE (E.C.)    Take 81 mg by mouth daily.       Order Dose: 81 mg    BUDESONIDE-FORMOTEROL (SYMBICORT) 160-4.5 MCG/ACTUATION HFA AEROSOL INHALER      Take  by inhalation.       Order Dose: --    CETIRIZINE (ZYRTEC) 10 MG TABLET    Take 10 mg by mouth daily.       Order Dose: 10 mg    LISINOPRIL  (ZESTRIL) 10 MG TABLET    Take 1 Tab by mouth daily.       Order Dose: 10 mg    OMEGA-3S/DHA/EPA/FISH OIL (OMEGA 3 PO)    Take  by mouth daily.       Order Dose: --    ROSUVASTATIN (CRESTOR) 10 MG TABLET    TAKE 1 TABLET BY MOUTH EVERY DAY IN   THE EVENING       Order Dose: --[GC.1]               ROS   Review of Systems   Constitutional: Negative.    HENT: Negative.    Eyes: Negative.    Respiratory: Negative.    Cardiovascular: Negative.    Gastrointestinal: Negative.    Endocrine: Negative.    Genitourinary: Negative.    Musculoskeletal: Negative.  Skin: Negative.    Allergic/Immunologic: Negative.    Neurological: Negative.    Hematological: Negative.    Psychiatric/Behavioral: Negative.[MW.1]    All other systems reviewed and are negative[GC.1].[MW.1]          Objective   Vitals:    07/22/18 1334   BP: 130/80   Pulse: 69   Weight: 165 lb (74.8 kg)   Height: 5\' 8"  (1.727 m)     Body mass index is 25.09 kg/m????.[GC.1]    Physical Exam[MW.1]  Vitals signs[GC.1] reviewed.   Constitutional:       General: He is[MW.1] not in acute distress[GC.1].     Appearance:[MW.1] Normal appearance[GC.1]. He is not[MW.1]   diaphoretic[GC.1].   Neck:      Vascular:[MW.1] Normal carotid pulses[GC.1]. No[MW.1] carotid   bruit[GC.1],[MW.1] hepatojugular reflux[GC.1] or[MW.1] JVD[GC.1].   Cardiovascular:      Rate and Rhythm:[MW.1] Normal rate[GC.1] and[MW.1] regular   rhythm[GC.1].[MW.1]  No extrasystoles[GC.1] are present.     Chest Wall:[MW.1] PMI is not displaced[GC.1].      Pulses:[MW.1] Normal pulses[GC.1].      Heart sounds:[MW.1] Normal heart sounds[GC.1],[MW.1] S1 normal[GC.1]   and[MW.1] S2 normal[GC.1].[MW.1] No murmur[GC.1]. WU[XL.2] systolic[GC.1]   murmur. GM[WN.0] diastolic[GC.1] murmur. No[MW.1] friction rub[GC.1]. No[MW.1]   gallop[GC.1]. No[MW.1] S3[GC.1] or[MW.1] S4[GC.1] sounds.    Pulmonary:      Effort: Pulmonary effort is[MW.1] normal[GC.1]. No[MW.1] respiratory   distress[GC.1].      Breath sounds: Normal[MW.1]  breath sounds[GC.1]. No[MW.1] wheezing[GC.1].   Abdominal:      General: Bowel sounds are[MW.1] normal[GC.1].      Tenderness: There is[MW.1] no abdominal tenderness[GC.1].   Psychiatric:         Judgment:[MW.1] Judgment[GC.1] normal.           IMPRESSION:[MW.1]      ICD-10-CM    1. Coronary artery disease involving native coronary artery of native heart   without angina pectoris  I25.10    2. Elevated coronary artery calcium score  R93.1    3. Palpitations  R00.2    4. SOB (shortness of breath)  R06.02    5. Dyslipidemia  E78.5[GC.1]        PLAN AND RECOMMENDATION:[MW.1]  1.  Return office visit 6 months.     2.  I have refilled his prescription for lisinopril 10 mg per day.   3.  Fasting lipid profile and liver enzymes in one year.   4.  I have asked him to bring his blood pressure log with him to the next   visit and will make any further adjustments to his antihypertensive regimen as   needed.[LW.2]           Electronically signed by Ferd Glassing. on 07/25/18 2106.   Attribution Key   GC.1 - Janace Aris., MD on 07/22/18 1348 LW.1 -   Ferd Glassing. on 07/23/18 2052 LW.2 - Ferd Glassing on 07/23/18 2051   MW.1 - Emilie Rutter, MA on 07/22/18 1334         Revision History      Date/Time User Provider Type Action   > 07/25/18 2106   Ferd Glassing - Sign    07/22/18 1348 Janace Aris., MD Physician   Sign when Signing Visit    07/22/18 [place], [place], [facility]   Assistant Sign when Signing Visit

## 2018-08-06 NOTE — Telephone Encounter (Signed)
Needs an appt/

## 2018-08-06 NOTE — Telephone Encounter (Signed)
Patient is calling because it has been a few years and he would like tropical lotion for his arthritis. Please give him a call back.      [facility] - [id number]. Phone no. [phone]    VOLTAREN 1% GEL

## 2018-08-06 NOTE — Telephone Encounter (Signed)
CALLED AND SPOKE WITH PT AND ADVISED. HS

## 2018-09-13 NOTE — Telephone Encounter (Signed)
HE WAS ADVISED LAST REFILL THAT HE NEEDS AN APPOINTMENT

## 2020-03-26 NOTE — Other (Unsigned)
Subjective  Harold Gordon  1960-03-17  Chief Complaint  Patient presents with  ?????? Other    Patient is here for 6 month check up    Harold Gordon comes in today for scheduled 6 month follow-up.  He has a history  CAD based on a coronary calcium score of 91.  He is treated for hypertension   and  hyperlipidemia.  He has palpitations which are under good control.  His blood  pressure has been running high.   he developed a cough on lisinopril and was  switched to losartan 100 mg per day.  His blood pressure continued to run   high.  I got 148/90 in the office today.  He is on Crestor 10 mg per day and his  cholesterol numbers are followed by his primary care physician in [place] and   I  do not have these results.  I have asked him to add amlodipine 5 mg per day to  his medical regimen and sent this to his pharmacy.  I have asked him keep a  blood pressure log on a daily basis and send me his readings after 30 days so   we  can make any changes as needed to his antihypertensive regimen.  I will plan   to  see him back in the office in 6 months but will be in touch with him regarding  the results of his blood pressure log.    Patient's Medications  Current Medications   ALBUTEROL (ALBUTEROL) 90 MCG/ACTUATION IN HFAA    Take 2 Puffs by inhalation.      Order Dose: 2 Puffs   ASPIRIN 81 MG TABLET, DELAYED RELEASE (E.C.)    Take 81 mg by mouth daily.      Order Dose: 81 mg   BUDESONIDE-FORMOTEROL (SYMBICORT) 160-4.5 MCG/ACTUATION HFA AEROSOL INHALER  Take  by inhalation.      Order Dose: --   CETIRIZINE (ZYRTEC) 10 MG TABLET    Take 10 mg by mouth daily.      Order Dose: 10 mg   LISINOPRIL (ZESTRIL) 40 MG TABLET    Take 1 Tablet by mouth daily.      Order Dose: 40 mg   LOSARTAN (COZAAR) 100 MG TABLET    Take 1 Tablet by mouth daily.      Order Dose: 100 mg   OMEGA-3S/DHA/EPA/FISH OIL (OMEGA 3 PO)    Take  by mouth daily.      Order Dose: --   ROSUVASTATIN (CRESTOR) 10 MG TABLET    TAKE 1 TABLET BY MOUTH EVERY DAY IN    THE  EVENING      Order Dose: --      Discontinued Medications   LISINOPRIL (ZESTRIL) 40 MG TABLET    Take 1 Tablet by mouth daily.      Notes: --   LISINOPRIL (ZESTRIL) 40 MG TABLET    TAKE 1 TABLET BY MOUTH EVERY DAY      Notes: --            Review of Systems  Constitutional: Negative.  HENT: Negative.  Eyes: Negative.  Respiratory: Negative.  Cardiovascular: Negative.  Gastrointestinal: Negative.  Endocrine: Negative.  Genitourinary: Negative.  Musculoskeletal: Negative.  Skin: Negative.  Allergic/Immunologic: Negative.  Neurological: Negative.  Hematological: Negative.  Psychiatric/Behavioral: Negative.  All other systems reviewed and are negative.          Vitals:   03/31/20 0931  BP: 130/76  Pulse: 81  Weight:  175 lb (79.4 kg)  Height: 5\' 8"  (1.727 m)    Body mass index is 26.61 kg/m??????.    Physical Exam  Vitals reviewed.  Constitutional:     General: He is not in acute distress.     Appearance: Normal appearance. He is not diaphoretic.  Neck:     Vascular: Normal carotid pulses. No carotid bruit, hepatojugular reflux or  JVD.  Cardiovascular:     Rate and Rhythm: Normal rate and regular rhythm.  No extrasystoles are  present.     Chest Wall: PMI is not displaced.     Pulses: Normal pulses.     Heart sounds: Normal heart sounds, S1 normal and S2 normal. No murmur   heard.   No systolic murmur is present.   No diastolic murmur is present.    No friction rub. No gallop. No S3 or S4 sounds.  Pulmonary:     Effort: Pulmonary effort is normal. No respiratory distress.     Breath sounds: Normal breath sounds. No wheezing.  Abdominal:     General: Bowel sounds are normal.     Tenderness: There is no abdominal tenderness.  Psychiatric:     Judgment: Judgment normal.                IMPRESSION:        ICD-10-CM  1. Coronary artery disease involving native coronary artery of native heart  without angina pectoris  I25.10  2. Elevated coronary artery calcium score  R93.1  3. Essential hypertension  I10  4. Palpitations   R00.2  5. Chest pain syndrome  R07.9  6. SOB (shortness of breath)  R06.02  7. Dyslipidemia  E78.5          PLAN AND RECOMMENDATION:    1. Return office visit 6 months.  2. Start amlodipine 5 mg per day.  3. I have asked him keep a blood pressure log on a daily basis and send me his  readings after 1 month.  4. Fasting lipid profile followed by PCP.

## 2020-10-06 NOTE — Other (Unsigned)
Progress Notes by Maxine Glenn., MD at 10/15/20 0845         Author: Maxine Glenn., MD Service:  Author Type: Physician  Filed:   10/15/20 0914 Encounter Date: 10/15/2020 Status: Signed  Editor: Maxine Glenn., MD (Physician)   Subjective   Harold Gordon  02/27/1960  Chief Complaint Patient presents with ? CAD[GC.1]   Amiri Kreuzer?comes in today for scheduled 6 month follow-up. ?He has a history   CAD based on a coronary calcium score of 91. ?He is treated for hypertension   and hyperlipidemia. ?He has palpitations which are under good control.?He   developed a cough on lisinopril and was switched to losartan 100 mg per day.   He brings his blood pressure log in with him today. Over the past couple of   months his blood pressures been uniformly quite good on his current medical   regimen so I have made no new changes. I will have him continue to monitor   this at home. He had fasting lipid profile prior to return today showing an   LDL of 69, HDL 92, triglycerides 67. Liver enzymes are normal.  Will continue   10 mg of Crestor per day. He is taking low-dose of aspirin per day. I have   encouraged him to remain active with exercise to stay in shape. I will plan to   see him back in 1 year and repeat his lipid profile at that time. I have   asked him to contact me should his blood pressure be running high in the   meantime.[GC.2]     Patient's Medications Current Medications  ALBUTEROL (ALBUTEROL) 90   MCG/ACTUATION IN HFAA  Take 2 Puffs by inhalation.   Order Dose: 2   Puffs  AMLODIPINE (NORVASC) 5 MG TABLET  Take 1 Tablet (5 mg total) by mouth   daily.   Order Dose: 5 mg  ASPIRIN 81 MG TABLET, DELAYED RELEASE (E.C.)  Take   81 mg by mouth daily.   Order Dose: 81 mg  BUDESONIDE-FORMOTEROL (SYMBICORT)   160-4.5 MCG/ACTUATION HFA AEROSOL INHALER  Take by inhalation.   Order Dose:   --  CETIRIZINE (ZYRTEC) 10 MG TABLET  Take 10 mg by mouth daily.   Order Dose:   10 mg  LOSARTAN (COZAAR) 100 MG  TABLET  TAKE 1 TABLET BY MOUTH EVERY DAY     Order Dose: --  OMEGA-3S/DHA/EPA/FISH OIL (OMEGA 3 PO)  Take by mouth daily.     Order Dose: --  ROSUVASTATIN (CRESTOR) 10 MG TABLET  TAKE 1 TABLET BY MOUTH   EVERY DAY IN THE EVENING   Order Dose: --[GC.1]             ROS   Review of Systems      Constitutional: Negative.   HENT: Negative.   Respiratory: Negative.   Cardiovascular: Negative.   Musculoskeletal: Negative.   Hematological: Negative.   Psychiatric/Behavioral: Negative.[LM.1]   All other systems reviewed and are negative[GC.1].[LM.1]         Objective   Vitals:    10/15/20 0859   BP: (!) 134/93   Pulse: 75   Weight: 173 lb (78.5 kg)   Height: 5\' 8"  (1.727 m)     Body mass index is 26.3 kg/m?.[GC.1]    Physical Exam[LM.1]  Vitals[GC.1] reviewed.   Constitutional:    General: He is[LM.1] not in acute distress[GC.1].   Appearance:[LM.1] Normal appearance[GC.1]. He is not[LM.1] diaphoretic[GC.1].  Neck:    Vascular:[LM.1] Normal carotid pulses[GC.1]. No[LM.1] carotid   bruit[GC.1],[LM.1] hepatojugular reflux[GC.1] or[LM.1] JVD[GC.1].   Cardiovascular:    Rate and Rhythm:[LM.1] Normal rate[GC.1] and[LM.1] regular   rhythm[GC.1].[LM.1] No extrasystoles[GC.1] are present.   Chest Wall:[LM.1] PMI is not displaced[GC.1].    Pulses:[LM.1] Normal pulses[GC.1].    Heart sounds:[LM.1] Normal heart sounds[GC.1],[LM.1] S1 normal[GC.1]   and[LM.1] S2 normal[GC.1].[LM.1] No murmur[GC.1] heard.[LM.1]   No systolic[GC.1] murmur is present.[LM.1]   No diastolic[GC.1] murmur is present.   No[LM.1] friction rub[GC.1]. No[LM.1] gallop[GC.1]. No[LM.1] S3[GC.1]   or[LM.1] S4[GC.1] sounds.   Pulmonary:    Effort: Pulmonary effort is[LM.1] normal[GC.1]. No[LM.1] respiratory   distress[GC.1].    Breath sounds: Normal[LM.1] breath sounds[GC.1]. No[LM.1] wheezing[GC.1].   Abdominal:    General: Bowel sounds are[LM.1] normal[GC.1].    Tenderness: There is[LM.1] no abdominal tenderness[GC.1].   Psychiatric:    Judgment:[LM.1]  Judgment[GC.1] normal.                  IMPRESSION:[LM.1]        ICD-10-CM    1. Coronary artery disease involving native coronary artery of native heart   without angina pectoris  I25.10    2. Elevated coronary artery calcium score  R93.1    3. Essential hypertension  I10    4. Palpitations  R00.2    5. SOB (shortness of breath)  R06.02    6. Dyslipidemia  E78.5[GC.1]            PLAN AND RECOMMENDATION:[LM.1]    1. Return office visit 1 year.   2. Draw fasting lipid profile liver enzymes prior to return.   3. Continue to keep a blood pressure log and bring these readings to his next   visit.   4. Counseled regarding diet and exercise for improving cardiovascular fitness   and losing weight.[GC.2]       Electronically signed by Maxine Glenn., MD on 10/15/20 9323.   Attribution Key     GC.1 - Maxine Glenn., MD on 10/15/20 0901  GC.2 - Maxine Glenn., MD on 10/15/20 0910  LM.1 - Liam Graham, [place] on 10/06/20 1045              Revision History           Date/Time  User  Provider Type  Action      >  10/15/20 0914  Maxine Glenn., MD  Physician  Sign        10/06/20 1046  Liam Graham, MA  Medical Assistant  Sign when Signing Visit

## 2023-02-08 IMAGING — MR MRI LUMBAR SPINE W/WO CONTRAST
6 of 12 series · 10 of 48 positions shown · IV contrast (gadavist)
Comparison: X-ray from January 31, 2023.

________________________________________________________________________________________________ 
MRI LUMBAR SPINE W/WO CONTRAST, 02/08/2023 [DATE]: 
CLINICAL INDICATION: Chronic and worsening low back pain with right lower 
extremity pain. Left foot neuropathy.
TECHNIQUE: Multiplanar, multiecho position MR images of the lumbar spine were 
performed without and with 7.5 mL of Gadavist were injected intravenously by 
hand. Patient was scanned on a 1.5T magnet

[Series 201: survey · axial · 10.0mm · 1.25mm/px · 1 of 10 slices shown]
[im 1/10]
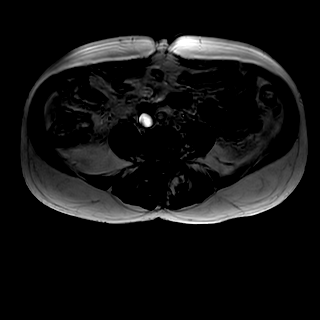

[Series 301: t2w_cor-surv · coronal · 6.0mm · 0.62mm/px · 1 of 10 slices shown]
[im 1/10]
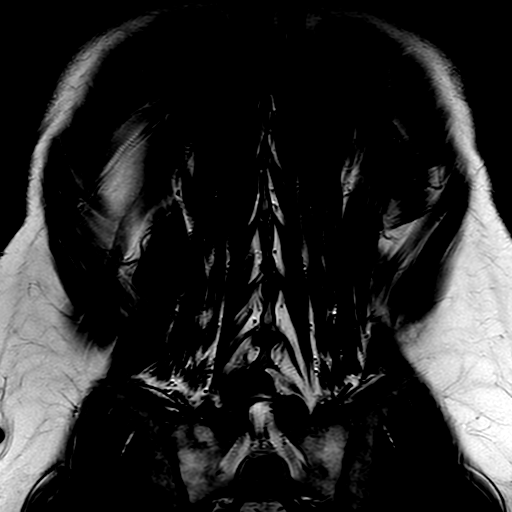

[Series 401: t1_tse_sag · sagittal · 4.0mm · 0.39mm/px · 2 of 19 slices shown]
[im 1/19]
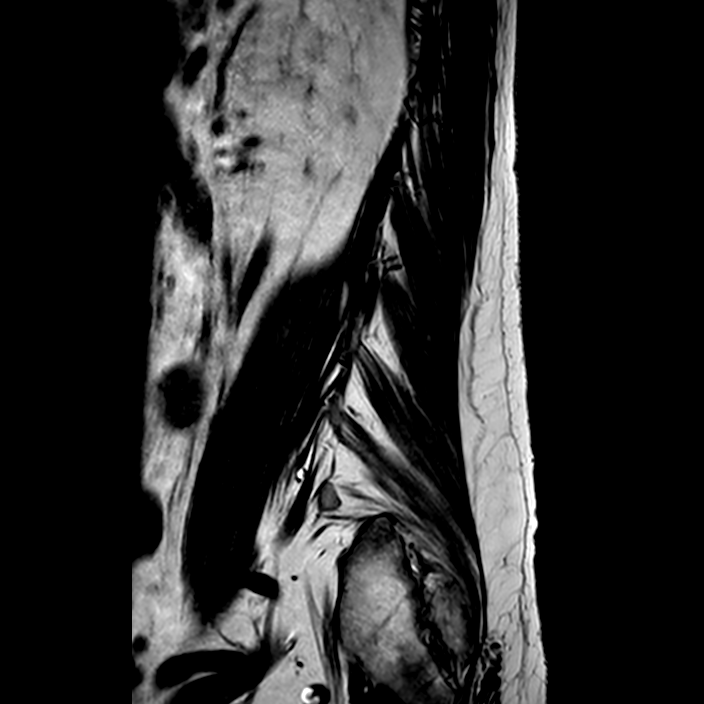
[im 19/19]
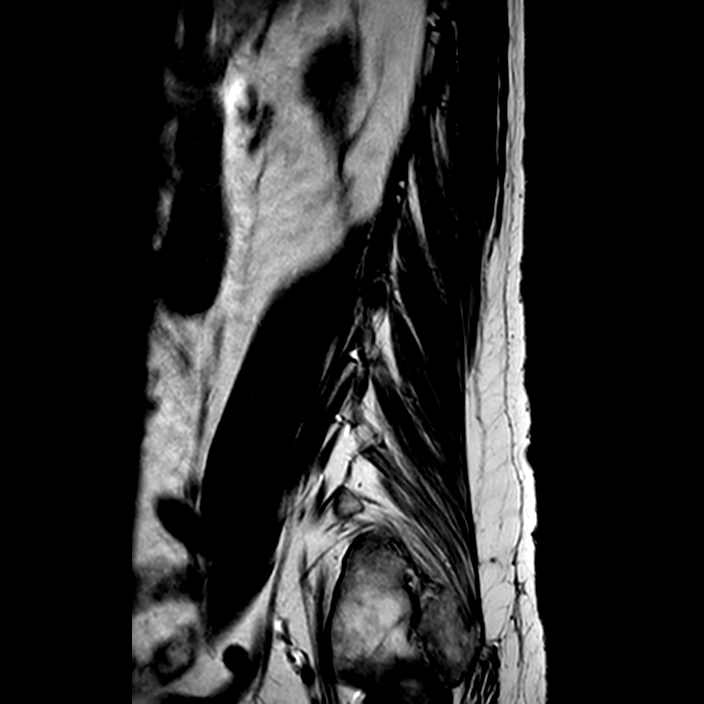

[Series 502: (id)_mdixon_tse · sagittal · 4.0mm · 0.48mm/px · 2 of 19 slices shown]
[im 1/19]
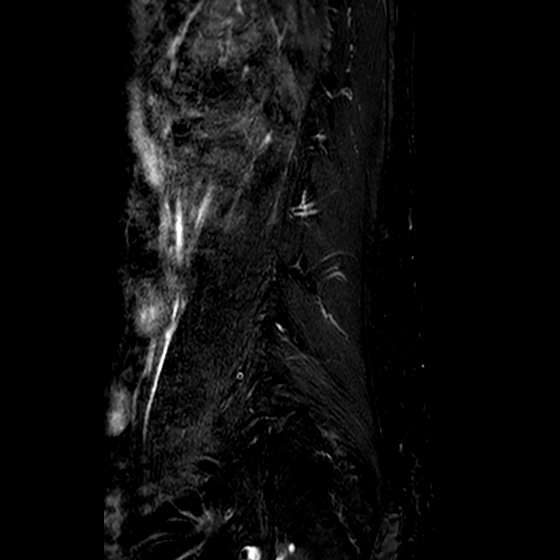
[im 19/19]
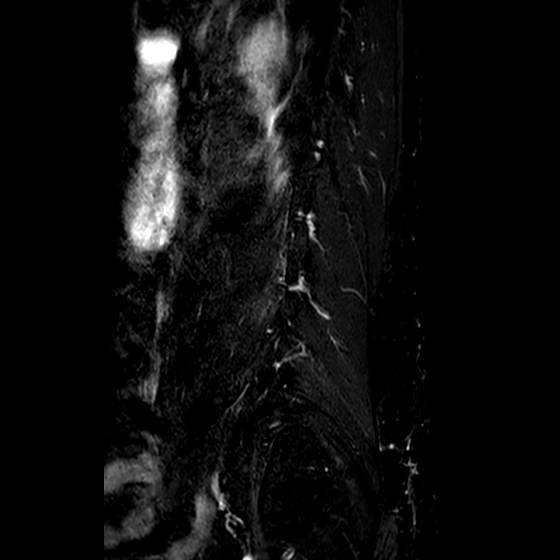

[Series 503: st2w_mdixon_tse · sagittal · 4.0mm · 0.48mm/px · 2 of 19 slices shown]
[im 1/19]
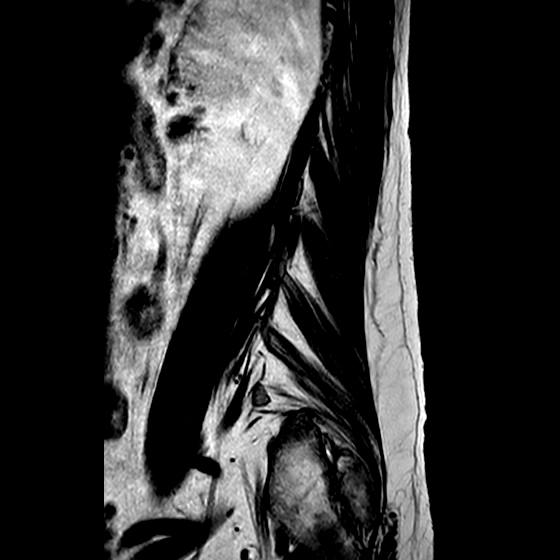
[im 19/19]
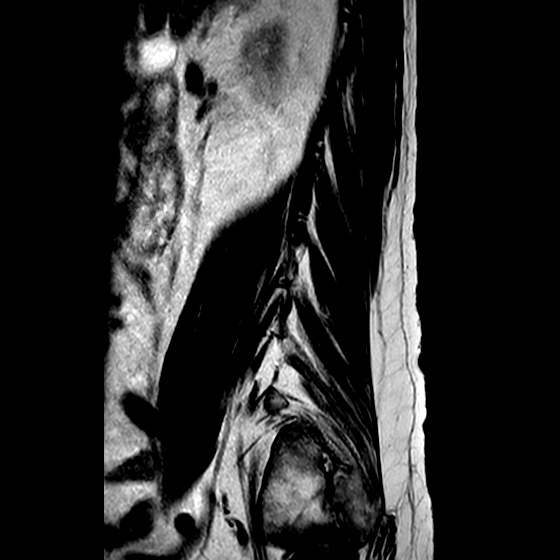

[Series 602: (id) view_ax mpr · axial · 1.0mm · 0.25mm/px · z∈[-207,-173]mm · 2 of 141 slices shown]
[im 1/141]
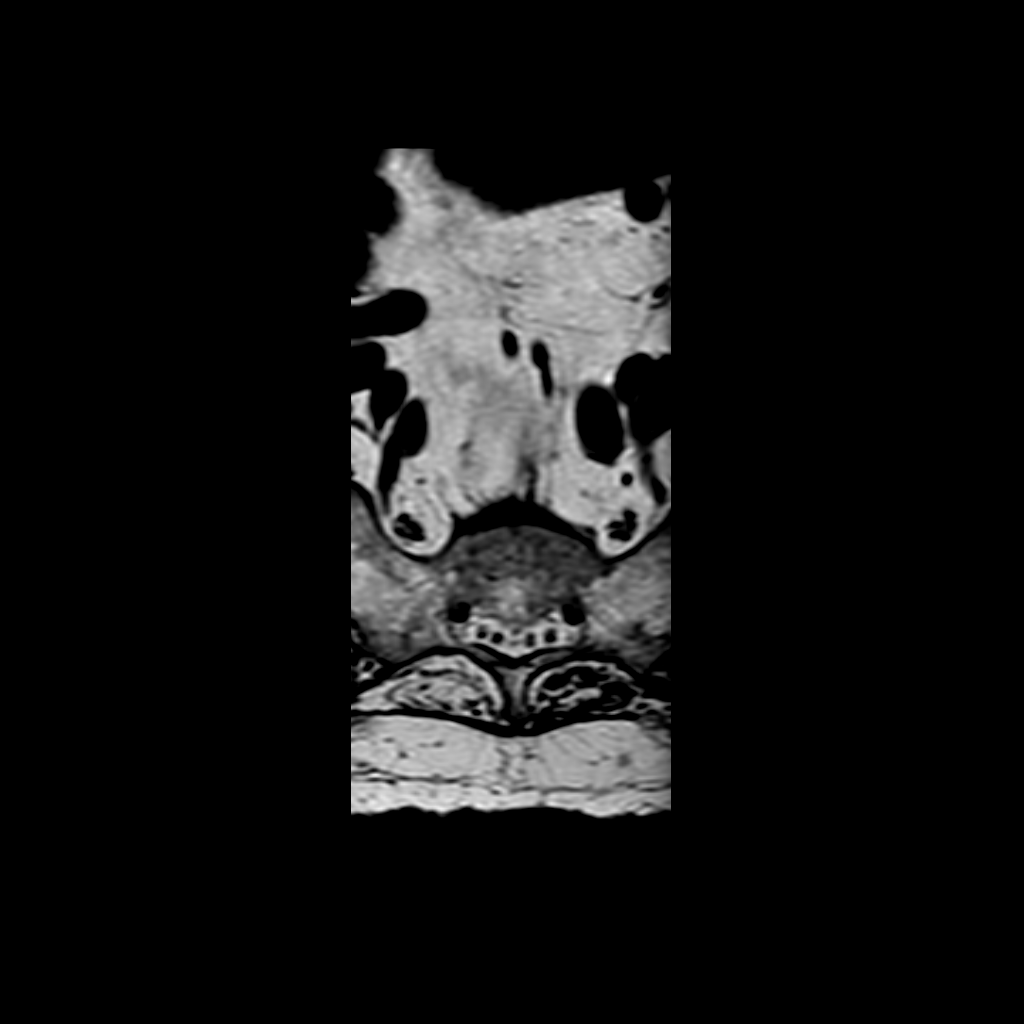
[im 21/141]
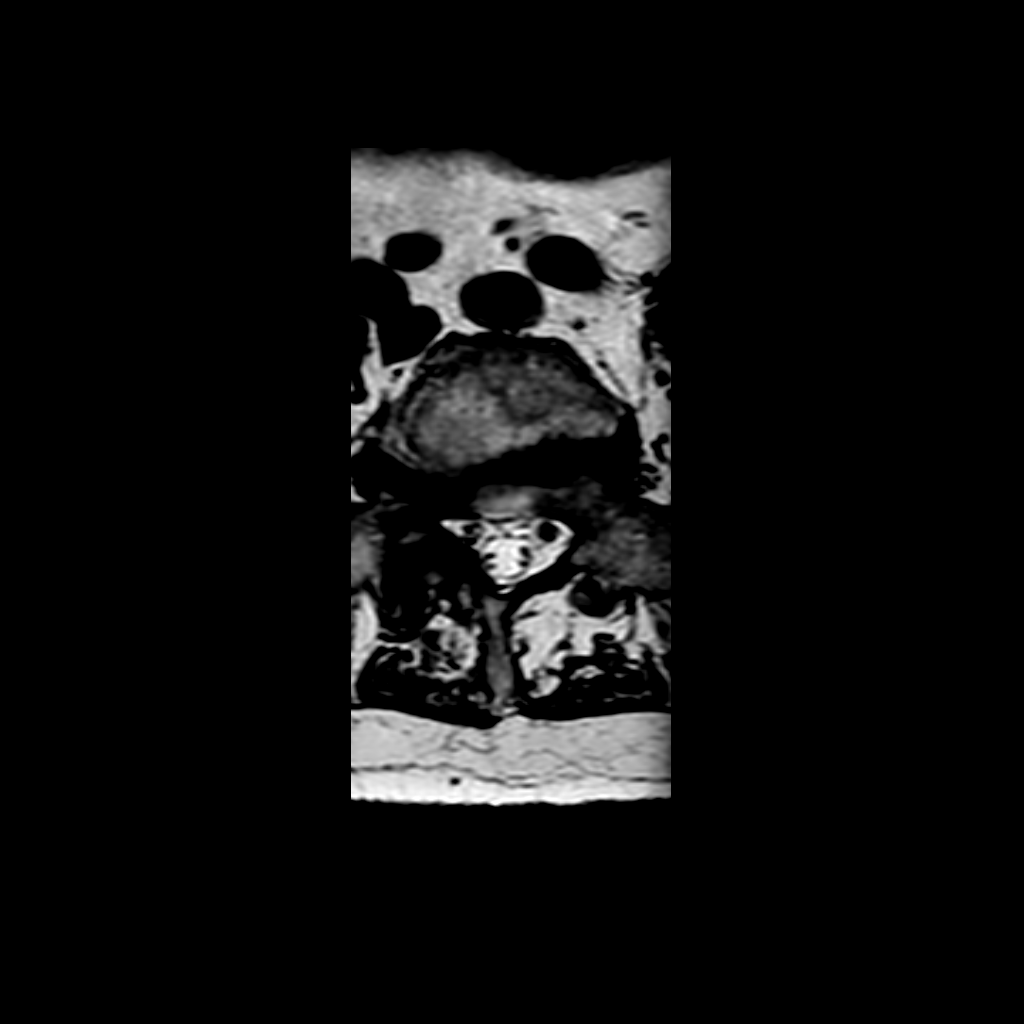

[10 of 48 positions shown; findings below may reference images not displayed]

FINDINGS: -------------------------------------------------------------------------------- 
------ 
GENERAL: 
Nomenclature is based on 5 lumbar type vertebral bodies.     
ALIGNMENT: Chronic left L5 spondylolysis. Mild retrolisthesis of L4 relative to 
L5. Grade 1 anterolisthesis of L5 on S1. 
VERTEBRAL BODY HEIGHT: Normal.  
MARROW SIGNAL: No focal suspect signal abnormality. 
CORD SIGNAL: Normal distal spinal cord and cauda equina.  Conus terminates at L2 
superior endplate level. No pathologic enhancement. 
ADDITIONAL FINDINGS: None. 
Modic I-II: Modic change at L5-S1, L4-L5. 
Ligamentum Flavum > 2.5 mm: All levels. 
-------------------------------------------------------------------------------- 
------ 
SEGMENTAL: 
T12-L1: No significant central canal narrowing.  No significant right neural 
foraminal narrowing. No significant left neural foraminal narrowing.  
L1-L2: No significant central canal narrowing.  No significant right neural 
foraminal narrowing. No significant left neural foraminal narrowing.  
L2-L3: Bilateral facet hypertrophy; no significant central canal narrowing.  No 
significant right neural foraminal narrowing. No significant left neural 
foraminal narrowing.  
L3-L4: Bilateral facet hypertrophy; no significant central canal narrowing.  No 
significant right neural foraminal narrowing. No significant left neural 
foraminal narrowing.  
L4-L5: Left hemilaminectomy suspected, loss of disc height posteriorly with disc 
bulge and central disc herniation surrounded by osteophytes, bilateral facet 
hypertrophy; moderate central canal narrowing with severe left subarticular 
recess narrowing.  Mild right neural foraminal narrowing. Mild left neural 
foraminal narrowing.  
L5-S1: Spondylolysis on the left side, right facet hypertrophy, uncovering of 
the disc space with disc bulge; no significant central canal narrowing, mild 
right subarticular recess narrowing.  Severe right neural foraminal narrowing. 
Severe left neural foraminal narrowing.  
-------------------------------------------------------------------------------- 
------
IMPRESSION: 1.  Severe left subarticular recess narrowing at L4-L5. 
2.  Severe bilateral neural foraminal narrowing L5-S1. 
3.  Left L5 spondylolysis, grade 1 spondylolisthesis of L5 on S1.
